# Patient Record
Sex: Male | Born: 1965 | Race: Black or African American | Hispanic: No | Marital: Single | Smoking: Current every day smoker
Health system: Southern US, Community
[De-identification: ages and names within clinical notes are randomized; demographics above are authoritative.]

## PROBLEM LIST (undated history)

## (undated) DIAGNOSIS — J45909 Unspecified asthma, uncomplicated: Secondary | ICD-10-CM

## (undated) DIAGNOSIS — I10 Essential (primary) hypertension: Secondary | ICD-10-CM

## (undated) DIAGNOSIS — E785 Hyperlipidemia, unspecified: Secondary | ICD-10-CM

## (undated) DIAGNOSIS — N200 Calculus of kidney: Secondary | ICD-10-CM

## (undated) HISTORY — DX: Essential (primary) hypertension: I10

## (undated) HISTORY — DX: Hyperlipidemia, unspecified: E78.5

## (undated) HISTORY — DX: Calculus of kidney: N20.0

## (undated) HISTORY — PX: BELOW KNEE LEG AMPUTATION: SUR23

---

## 2007-07-16 ENCOUNTER — Emergency Department (HOSPITAL_COMMUNITY): Admission: EM | Admit: 2007-07-16 | Discharge: 2007-07-16 | Payer: Self-pay | Admitting: Emergency Medicine

## 2009-06-15 ENCOUNTER — Emergency Department (HOSPITAL_BASED_OUTPATIENT_CLINIC_OR_DEPARTMENT_OTHER): Admission: EM | Admit: 2009-06-15 | Discharge: 2009-06-15 | Payer: Self-pay | Admitting: Emergency Medicine

## 2009-08-22 ENCOUNTER — Emergency Department (HOSPITAL_BASED_OUTPATIENT_CLINIC_OR_DEPARTMENT_OTHER): Admission: EM | Admit: 2009-08-22 | Discharge: 2009-08-22 | Payer: Self-pay | Admitting: Emergency Medicine

## 2009-08-22 ENCOUNTER — Ambulatory Visit: Payer: Self-pay | Admitting: Diagnostic Radiology

## 2010-04-03 LAB — URINALYSIS, ROUTINE W REFLEX MICROSCOPIC
Bilirubin Urine: NEGATIVE
Hgb urine dipstick: NEGATIVE
Ketones, ur: NEGATIVE mg/dL
Protein, ur: NEGATIVE mg/dL
pH: 7 (ref 5.0–8.0)

## 2010-04-03 LAB — URINE CULTURE
Culture  Setup Time: 201108051740
Culture: NO GROWTH

## 2010-10-15 LAB — POCT CARDIAC MARKERS
Operator id: 272551
Troponin i, poc: <0.05

## 2010-10-15 LAB — POCT I-STAT, CHEM 8
BUN: 15
Chloride: 106
Sodium: 137
TCO2: 23

## 2010-10-15 LAB — CBC
MCV: 86.2
Platelets: 289
WBC: 7.5

## 2010-10-15 LAB — DIFFERENTIAL
Eosinophils Relative: 2
Monocytes Absolute: 1
Neutro Abs: 4.2

## 2010-10-15 LAB — D-DIMER, QUANTITATIVE: D-Dimer, Quant: 6.37 — ABNORMAL HIGH

## 2011-06-17 ENCOUNTER — Emergency Department (HOSPITAL_BASED_OUTPATIENT_CLINIC_OR_DEPARTMENT_OTHER)
Admission: EM | Admit: 2011-06-17 | Discharge: 2011-06-17 | Disposition: A | Discharge status: Home / Self Care | Payer: BC Managed Care – PPO | Attending: Emergency Medicine | Admitting: Emergency Medicine

## 2011-06-17 ENCOUNTER — Encounter (HOSPITAL_BASED_OUTPATIENT_CLINIC_OR_DEPARTMENT_OTHER): Payer: Self-pay | Admitting: Emergency Medicine

## 2011-06-17 ENCOUNTER — Emergency Department (HOSPITAL_BASED_OUTPATIENT_CLINIC_OR_DEPARTMENT_OTHER): Payer: BC Managed Care – PPO

## 2011-06-17 DIAGNOSIS — Y9367 Activity, basketball: Secondary | ICD-10-CM | POA: Insufficient documentation

## 2011-06-17 DIAGNOSIS — X500XXA Overexertion from strenuous movement or load, initial encounter: Secondary | ICD-10-CM | POA: Insufficient documentation

## 2011-06-17 DIAGNOSIS — S93409A Sprain of unspecified ligament of unspecified ankle, initial encounter: Secondary | ICD-10-CM | POA: Insufficient documentation

## 2011-06-17 MED ORDER — IBUPROFEN 800 MG PO TABS: 800.0000 mg | ORAL_TABLET | Freq: Three times a day (TID) | ORAL | Status: AC | PRN

## 2011-06-17 NOTE — ED Notes (Signed)
Pt playing basketball last evening.  Jumped up and when he came down he landed on someone else's foot, twisting his right ankle inward.

## 2011-06-17 NOTE — Discharge Instructions (Signed)
Ankle Sprain An ankle sprain is an injury to the strong, fibrous tissues (ligaments) that hold the bones of your ankle joint together.  CAUSES Ankle sprain usually is caused by a fall or by twisting your ankle. People who participate in sports are more prone to these types of injuries.  SYMPTOMS  Symptoms of ankle sprain include:  Pain in your ankle. The pain may be present at rest or only when you are trying to stand or walk.   Swelling.   Bruising. Bruising may develop immediately or within 1 to 2 days after your injury.   Difficulty standing or walking.  DIAGNOSIS  Your caregiver will ask you details about your injury and perform a physical exam of your ankle to determine if you have an ankle sprain. During the physical exam, your caregiver will press and squeeze specific areas of your foot and ankle. Your caregiver will try to move your ankle in certain ways. An X-ray exam may be done to be sure a bone was not broken or a ligament did not separate from one of the bones in your ankle (avulsion).  TREATMENT  Certain types of braces can help stabilize your ankle. Your caregiver can make a recommendation for this. Your caregiver may recommend the use of medication for pain. If your sprain is severe, your caregiver may refer you to a surgeon who helps to restore function to parts of your skeletal system (orthopedist) or a physical therapist. HOME CARE INSTRUCTIONS  Apply ice to your injury for 1 to 2 days or as directed by your caregiver. Applying ice helps to reduce inflammation and pain.  Put ice in a plastic bag.   Place a towel between your skin and the bag.   Leave the ice on for 15 to 20 minutes at a time, every 2 hours while you are awake.   Take over-the-counter or prescription medicines for pain, discomfort, or fever only as directed by your caregiver.   Keep your injured leg elevated, when possible, to lessen swelling.   If your caregiver recommends crutches, use them as  instructed. Gradually, put weight on the affected ankle. Continue to use crutches or a cane until you can walk without feeling pain in your ankle.   If you have a plaster splint, wear the splint as directed by your caregiver. Do not rest it on anything harder than a pillow the first 24 hours. Do not put weight on it. Do not get it wet. You may take it off to take a shower or bath.   You may have been given an elastic bandage to wear around your ankle to provide support. If the elastic bandage is too tight (you have numbness or tingling in your foot or your foot becomes cold and blue), adjust the bandage to make it comfortable.   If you have an air splint, you may blow more air into it or let air out to make it more comfortable. You may take your splint off at night and before taking a shower or bath.   Wiggle your toes in the splint several times per day if you are able.  SEEK MEDICAL CARE IF:   You have an increase in bruising, swelling, or pain.   Your toes feel cold.   Pain relief is not achieved with medication.  SEEK IMMEDIATE MEDICAL CARE IF: Your toes are numb or blue or you have severe pain. MAKE SURE YOU:   Understand these instructions.   Will watch your condition.     Will get help right away if you are not doing well or get worse.  Document Released: 01/04/2005 Document Revised: 12/24/2010 Document Reviewed: 08/09/2007 [employer] Patient Information 2012 [employer]. 

## 2011-06-17 NOTE — ED Provider Notes (Signed)
History     CSN: [id number]  Arrival date & time 06/17/11  1546   First MD Initiated Contact with Patient 06/17/11 1610      Chief Complaint  Patient presents with  . Ankle Injury    (Consider location/radiation/quality/duration/timing/severity/associated sxs/prior treatment) The history is provided by the patient.  46 yo male with complaint of ankle injury.  States that he was playing basketball last pm, jumped up and landed on another persons foot resulting in him rolling his ankle inwards.  Pain initially that has continued overnight.  Difficulty walking.  Swelling gradually getting worse overnight.  Has not tried any treatments.  Denies any popping or snapping.    History reviewed. No pertinent past medical history.  History reviewed. No pertinent past surgical history.  No family history on file.  History  Substance Use Topics  . Smoking status: Current Everyday Smoker -- 0.5 packs/day  . Smokeless tobacco: Not on file  . Alcohol Use: Yes     Socially      Review of Systems  Cardiovascular: Negative for leg swelling.  Musculoskeletal: Negative for arthralgias.  Neurological: Negative for weakness and numbness.    Allergies  Review of patient's allergies indicates no known allergies.  Home Medications  No current outpatient prescriptions on file.  BP 116/71  Pulse 66  Temp(Src) 98.3 F (36.8 C) (Oral)  Resp 16  Ht 5\' 8"  (1.727 m)  Wt 176 lb (79.833 kg)  BMI 26.76 kg/m2  SpO2 100%  Physical Exam  Constitutional: He is oriented to person, place, and time. He appears well-nourished.  HENT:  Head: Normocephalic and atraumatic.  Musculoskeletal:       Swelling along lateral aspect of ankle with tenderness along ligamentous complexes.  Some tenderness along anterior portion of distal tibia.  No tenderness at medial or lateral malleolus, or foot.   Patient with difficulty walking on that ankle.     Neurological: He is alert and oriented to person, place, and  time.  Skin:       Small amount of bruising along lateral ankle.     ED Course  Procedures (including critical care time)  Labs Reviewed - No data to display No results found.   No diagnosis found.    MDM  Likely sprain, check ankle films given difficulty walking.    Ankle films without fracture.  As above likely sever sprain.  Will place in air cast, crutches provided.  Home care instructions given including icing and pain control.         Everrett Coombe, DO 06/17/11 1905

## 2011-06-17 NOTE — ED Provider Notes (Signed)
I saw and evaluated the patient, reviewed the resident's note and I agree with the findings and plan. Patient with a mechanical injury to the right ankle noted swelling and pain. Plain films are negative for acute fracture  Gwyneth Sprout, MD 06/17/11 2355

## 2011-06-21 NOTE — ED Notes (Signed)
Pt called stating his ankle feels much better and would like to have [place] fax a note to his employer that he can work without restrictions.

## 2011-07-12 NOTE — ED Notes (Signed)
Patient called requesting insurance info forms to be filled out.  Phone # states phone is disconnected.

## 2012-09-21 ENCOUNTER — Emergency Department (HOSPITAL_BASED_OUTPATIENT_CLINIC_OR_DEPARTMENT_OTHER): Payer: BC Managed Care – PPO

## 2012-09-21 ENCOUNTER — Encounter (HOSPITAL_BASED_OUTPATIENT_CLINIC_OR_DEPARTMENT_OTHER): Payer: Self-pay | Admitting: Emergency Medicine

## 2012-09-21 ENCOUNTER — Emergency Department (HOSPITAL_BASED_OUTPATIENT_CLINIC_OR_DEPARTMENT_OTHER)
Admission: EM | Admit: 2012-09-21 | Discharge: 2012-09-21 | Disposition: A | Discharge status: Home / Self Care | Payer: BC Managed Care – PPO | Attending: Emergency Medicine | Admitting: Emergency Medicine

## 2012-09-21 DIAGNOSIS — R0789 Other chest pain: Secondary | ICD-10-CM

## 2012-09-21 DIAGNOSIS — R209 Unspecified disturbances of skin sensation: Secondary | ICD-10-CM | POA: Insufficient documentation

## 2012-09-21 DIAGNOSIS — F17200 Nicotine dependence, unspecified, uncomplicated: Secondary | ICD-10-CM | POA: Insufficient documentation

## 2012-09-21 DIAGNOSIS — R071 Chest pain on breathing: Secondary | ICD-10-CM | POA: Insufficient documentation

## 2012-09-21 LAB — CBC WITH DIFFERENTIAL/PLATELET
Eosinophils Absolute: 0.1 10*3/uL (ref 0.0–0.7)
Eosinophils Relative: 1 % (ref 0–5)
HCT: 42.6 % (ref 39.0–52.0)
Hemoglobin: 14.7 g/dL (ref 13.0–17.0)
Lymphocytes Relative: 41 % (ref 12–46)
MCH: 30 pg (ref 26.0–34.0)
MCV: 86.9 fL (ref 78.0–100.0)
Monocytes Relative: 11 % (ref 3–12)
RBC: 4.9 MIL/uL (ref 4.22–5.81)
RDW: 13.9 % (ref 11.5–15.5)

## 2012-09-21 LAB — BASIC METABOLIC PANEL
Calcium: 9.9 mg/dL (ref 8.4–10.5)
Chloride: 105 mEq/L (ref 96–112)
Creatinine, Ser: 1 mg/dL (ref 0.50–1.35)
GFR calc Af Amer: >90 mL/min (ref >90)
Glucose, Bld: 136 mg/dL — ABNORMAL HIGH (ref 70–99)
Potassium: 3.4 mEq/L — ABNORMAL LOW (ref 3.5–5.1)
Sodium: 142 mEq/L (ref 135–145)

## 2012-09-21 LAB — TROPONIN I: Troponin I: <0.3 ng/mL (ref <0.30)

## 2012-09-21 MED ORDER — ASPIRIN 81 MG PO CHEW
324.0000 mg | CHEWABLE_TABLET | Freq: Once | ORAL | Status: AC
  Administered 2012-09-21: 324 mg via ORAL
  Filled 2012-09-21: qty 4

## 2012-09-21 MED ORDER — GI COCKTAIL ~~LOC~~
ORAL | Status: AC
  Administered 2012-09-21: 30 mL via ORAL
  Filled 2012-09-21: qty 30

## 2012-09-21 MED ORDER — GI COCKTAIL ~~LOC~~
30.0000 mL | Freq: Once | ORAL | Status: AC
  Administered 2012-09-21: 30 mL via ORAL

## 2012-09-21 NOTE — ED Provider Notes (Signed)
CSN: [id number]     Arrival date & time 09/21/12  1743 History   First MD Initiated Contact with Patient 09/21/12 1819     Chief Complaint  Patient presents with  . Chest Pain  . Numbness   (Consider location/radiation/quality/duration/timing/severity/associated sxs/prior Treatment) Patient is a 47 y.o. male presenting with chest pain. The history is provided by the patient. No language interpreter was used.  Chest Pain Pain location:  L chest Pain quality: stabbing   Pain radiates to:  Does not radiate Pain radiates to the back: no   Pain severity:  Moderate Onset quality:  Gradual Timing:  Constant Chronicity:  New Relieved by:  Nothing Worsened by:  Nothing tried Pt complains of sharp stabbing pain in the right side of his chest.   Pt reports strenuous work today.  History reviewed. No pertinent past medical history. History reviewed. No pertinent past surgical history. No family history on file. History  Substance Use Topics  . Smoking status: Current Every Day Smoker -- 0.50 packs/day  . Smokeless tobacco: Not on file  . Alcohol Use: Yes     Comment: Socially    Review of Systems  Cardiovascular: Positive for chest pain.  All other systems reviewed and are negative.    Allergies  Review of patient's allergies indicates no known allergies.  Home Medications  No current outpatient prescriptions on file. BP 140/85  Pulse 88  Temp(Src) 98.6 F (37 C) (Oral)  Resp 18  Ht 5\' 5"  (1.651 m)  Wt 185 lb (83.915 kg)  BMI 30.79 kg/m2  SpO2 100% Physical Exam  Nursing note and vitals reviewed. Constitutional: He is oriented to person, place, and time. He appears well-developed and well-nourished.  HENT:  Head: Normocephalic.  Right Ear: External ear normal.  Left Ear: External ear normal.  Nose: Nose normal.  Mouth/Throat: Oropharynx is clear and moist.  Eyes: Conjunctivae and EOM are normal. Pupils are equal, round, and reactive to light.  Neck: Normal range of  motion. Neck supple.  Cardiovascular: Normal rate and regular rhythm.   Pulmonary/Chest: Effort normal.  Abdominal: Soft.  Musculoskeletal: Normal range of motion.  Neurological: He is alert and oriented to person, place, and time. He has normal reflexes.  Skin: Skin is warm.  Psychiatric: He has a normal mood and affect.    ED Course  Procedures (including critical care time) Labs Review Labs Reviewed  BASIC METABOLIC PANEL - Abnormal; Notable for the following:    Potassium 3.4 (*)    Glucose, Bld 136 (*)    GFR calc non Af Amer 89 (*)    All other components within normal limits  CBC WITH DIFFERENTIAL  TROPONIN I   Imaging Review Dg Chest 2 View  09/21/2012   *RADIOLOGY REPORT*  Clinical Data: Chest pain  CHEST - 2 VIEW  Comparison: July 16, 2007  Findings: There is no focal infiltrate, pulmonary edema, or pleural effusion.  The mediastinal contour is stable.  The heart size is normal.  The soft tissues and osseous structures are normal.  IMPRESSION: No acute cardiopulmonary disease identified   Original Report Authenticated By: Sherian Rein, M.D.   Date: 09/21/2012  Rate: 83  Rhythm: normal sinus rhythm  QRS Axis: normal  Intervals: normal  ST/T Wave abnormalities: normal  Conduction Disutrbances:nonspecific intraventricular conduction delay  Narrative Interpretation:   Old EKG Reviewed: none available Results for orders placed during the hospital encounter of 09/21/12  CBC WITH DIFFERENTIAL      Result Value Range  WBC 6.0  4.0 - 10.5 K/uL   RBC 4.90  4.22 - 5.81 MIL/uL   Hemoglobin 14.7  13.0 - 17.0 g/dL   HCT 11.9  14.7 - 82.9 %   MCV 86.9  78.0 - 100.0 fL   MCH 30.0  26.0 - 34.0 pg   MCHC 34.5  30.0 - 36.0 g/dL   RDW 56.2  13.0 - 86.5 %   Platelets 249  150 - 400 K/uL   Neutrophils Relative % 47  43 - 77 %   Neutro Abs 2.9  1.7 - 7.7 K/uL   Lymphocytes Relative 41  12 - 46 %   Lymphs Abs 2.4  0.7 - 4.0 K/uL   Monocytes Relative 11  3 - 12 %   Monocytes  Absolute 0.7  0.1 - 1.0 K/uL   Eosinophils Relative 1  0 - 5 %   Eosinophils Absolute 0.1  0.0 - 0.7 K/uL   Basophils Relative 1  0 - 1 %   Basophils Absolute 0.0  0.0 - 0.1 K/uL  BASIC METABOLIC PANEL      Result Value Range   Sodium 142  135 - 145 mEq/L   Potassium 3.4 (*) 3.5 - 5.1 mEq/L   Chloride 105  96 - 112 mEq/L   CO2 25  19 - 32 mEq/L   Glucose, Bld 136 (*) 70 - 99 mg/dL   BUN 16  6 - 23 mg/dL   Creatinine, Ser 7.84  0.50 - 1.35 mg/dL   Calcium 9.9  8.4 - 69.6 mg/dL   GFR calc non Af Amer 89 (*) >90 mL/min   GFR calc Af Amer >90  >90 mL/min  TROPONIN I      Result Value Range   Troponin I <0.30  <0.30 ng/mL  TROPONIN I      Result Value Range   Troponin I <0.30  <0.30 ng/mL   Dg Chest 2 View  09/21/2012   *RADIOLOGY REPORT*  Clinical Data: Chest pain  CHEST - 2 VIEW  Comparison: July 16, 2007  Findings: There is no focal infiltrate, pulmonary edema, or pleural effusion.  The mediastinal contour is stable.  The heart size is normal.  The soft tissues and osseous structures are normal.  IMPRESSION: No acute cardiopulmonary disease identified   Original Report Authenticated By: Sherian Rein, M.D.     MDM   1. Chest pain, atypical    Pt does not want to be admitted.   Troponin negative x 2.   Pain is constant, atypical.   Pt advised of risk of heart disease and that he could have CAD.   Pt given primary care referrals,   Pt advised of need to return if pain worsens changes.  Pt counseled by me at length about risk.  Pt has a court date in am that he reports he can not miss.     Lonia Skinner Hardwick, PA-C 09/21/12 2256

## 2012-09-21 NOTE — ED Notes (Signed)
The discharge disposition entered at 2257 hours was entered on the wrong patient. Please disregard entry.

## 2012-09-21 NOTE — ED Provider Notes (Signed)
Medical screening examination/treatment/procedure(s) were performed by non-physician practitioner and as supervising physician I was immediately available for consultation/collaboration.   Cary Lothrop, MD 09/21/12 2348 

## 2012-09-21 NOTE — ED Notes (Signed)
Pt at work at 1700 and got sharp left sided cp.  Nonradiating.  Numbness in left arm.  No hx of cp.

## 2012-09-21 NOTE — ED Notes (Signed)
Esignature at 1101PM error. Wrong chart.

## 2013-03-27 ENCOUNTER — Encounter (HOSPITAL_BASED_OUTPATIENT_CLINIC_OR_DEPARTMENT_OTHER): Payer: Self-pay | Admitting: Emergency Medicine

## 2013-03-27 ENCOUNTER — Emergency Department (HOSPITAL_BASED_OUTPATIENT_CLINIC_OR_DEPARTMENT_OTHER): Payer: BC Managed Care – PPO

## 2013-03-27 ENCOUNTER — Emergency Department (HOSPITAL_BASED_OUTPATIENT_CLINIC_OR_DEPARTMENT_OTHER)
Admission: EM | Admit: 2013-03-27 | Discharge: 2013-03-27 | Disposition: A | Discharge status: Home / Self Care | Payer: BC Managed Care – PPO | Attending: Emergency Medicine | Admitting: Emergency Medicine

## 2013-03-27 DIAGNOSIS — M79604 Pain in right leg: Secondary | ICD-10-CM

## 2013-03-27 DIAGNOSIS — S99919A Unspecified injury of unspecified ankle, initial encounter: Principal | ICD-10-CM

## 2013-03-27 DIAGNOSIS — F17200 Nicotine dependence, unspecified, uncomplicated: Secondary | ICD-10-CM | POA: Insufficient documentation

## 2013-03-27 DIAGNOSIS — S8990XA Unspecified injury of unspecified lower leg, initial encounter: Secondary | ICD-10-CM | POA: Insufficient documentation

## 2013-03-27 DIAGNOSIS — IMO0002 Reserved for concepts with insufficient information to code with codable children: Secondary | ICD-10-CM | POA: Insufficient documentation

## 2013-03-27 DIAGNOSIS — Y9301 Activity, walking, marching and hiking: Secondary | ICD-10-CM | POA: Insufficient documentation

## 2013-03-27 DIAGNOSIS — S99929A Unspecified injury of unspecified foot, initial encounter: Principal | ICD-10-CM

## 2013-03-27 DIAGNOSIS — Y929 Unspecified place or not applicable: Secondary | ICD-10-CM | POA: Insufficient documentation

## 2013-03-27 MED ORDER — HYDROCODONE-ACETAMINOPHEN 5-325 MG PO TABS: 1.0000 | ORAL_TABLET | Freq: Four times a day (QID) | ORAL | Status: DC | PRN

## 2013-03-27 NOTE — Discharge Instructions (Signed)
Musculoskeletal Pain °Musculoskeletal pain is muscle and boney aches and pains. These pains can occur in any part of the body. Your caregiver may treat you without knowing the cause of the pain. They may treat you if blood or urine tests, X-rays, and other tests were normal.  °CAUSES °There is often not a definite cause or reason for these pains. These pains may be caused by a type of germ (virus). The discomfort may also come from overuse. Overuse includes working out too hard when your body is not fit. Boney aches also come from weather changes. Bone is sensitive to atmospheric pressure changes. °HOME CARE INSTRUCTIONS  °· Ask when your test results will be ready. Make sure you get your test results. °· Only take over-the-counter or prescription medicines for pain, discomfort, or fever as directed by your caregiver. If you were given medications for your condition, do not drive, operate machinery or power tools, or sign legal documents for 24 hours. Do not drink alcohol. Do not take sleeping pills or other medications that may interfere with treatment. °· Continue all activities unless the activities cause more pain. When the pain lessens, slowly resume normal activities. Gradually increase the intensity and duration of the activities or exercise. °· During periods of severe pain, bed rest may be helpful. Lay or sit in any position that is comfortable. °· Putting ice on the injured area. °· Put ice in a bag. °· Place a towel between your skin and the bag. °· Leave the ice on for 15 to 20 minutes, 3 to 4 times a day. °· Follow up with your caregiver for continued problems and no reason can be found for the pain. If the pain becomes worse or does not go away, it may be necessary to repeat tests or do additional testing. Your caregiver may need to look further for a possible cause. °SEEK IMMEDIATE MEDICAL CARE IF: °· You have pain that is getting worse and is not relieved by medications. °· You develop chest pain  that is associated with shortness or breath, sweating, feeling sick to your stomach (nauseous), or throw up (vomit). °· Your pain becomes localized to the abdomen. °· You develop any new symptoms that seem different or that concern you. °MAKE SURE YOU:  °· Understand these instructions. °· Will watch your condition. °· Will get help right away if you are not doing well or get worse. °Document Released: 01/04/2005 Document Revised: 03/29/2011 Document Reviewed: 09/08/2012 °ExitCare® Patient Information ©2014 [employer]. ° ° °Emergency Department Resource Guide °1) Find a Doctor and Pay Out of Pocket °Although you won't have to find out who is covered by your insurance plan, it is a good idea to ask around and get recommendations. You will then need to call the office and see if the doctor you have chosen will accept you as a new patient and what types of options they offer for patients who are self-pay. Some doctors offer discounts or will set up payment plans for their patients who do not have insurance, but you will need to ask so you aren't surprised when you get to your appointment. ° °2) Contact Your Local Health Department °Not all health departments have doctors that can see patients for sick visits, but many do, so it is worth a call to see if yours does. If you don't know where your local health department is, you can check in your phone book. The CDC also has a tool to help you locate your state's   health department, and many state websites also have listings of all of their local health departments. ° °3) Find a Walk-in Clinic °If your illness is not likely to be very severe or complicated, you may want to try a walk in clinic. These are popping up all over the country in pharmacies, drugstores, and shopping centers. They're usually staffed by nurse practitioners or physician assistants that have been trained to treat common illnesses and complaints. They're usually fairly quick and inexpensive. However,  if you have serious medical issues or chronic medical problems, these are probably not your best option. ° °No Primary Care Doctor: °- Call [facility] at  832-8000 - they can help you locate a primary care doctor that  accepts your insurance, provides certain services, etc. °- Physician Referral Service- 1-800-533-3463 ° °Chronic Pain Problems: °Organization         Address  Phone   Notes  °[facility]  (336) 297-2271 Patients need to be referred by their primary care doctor.  ° °Medication Assistance: °Organization         Address  Phone   Notes  °[place] [facility] [address] °[place], [place] [address] (336) 641-8030 --Must be a resident of [place] °-- Must have NO insurance coverage whatsoever (no Medicaid/ Medicare, etc.) °-- The pt. MUST have a primary care doctor that directs their care regularly and follows them in the community °  °[facility]  (866) 331-1348   °[employer]  (888) 892-1162   ° °Agencies that provide inexpensive medical care: °Organization         Address  Phone   Notes  °[facility]  (336) 832-8035   °[facility]    (336) 832-7272   °[facility] [address] °[place], [place] [address] (336) 832-4777   °[facility] [address], °[place] (336) 271-4999   °[facility]    (336) 373-0678   °[facility]    (336) 272-1050   °[facility] ° [address], [place] Phone:  (336) 832-4444, Fax:  (336) 832-4440 Hours of Operation:  9 am - 6 pm, M-F.  Also accepts Medicaid/Medicare and self-pay.  °[facility] ° [address], [place] Phone: (336) 832-3150, Fax: (336) 832-3151. Hours of Operation:  8:30 am - 5:30 pm, M-F.  Also accepts Medicaid and self-pay.  °[facility] [place] [address], [place] Phone: (336) 878-6027   °[facility] [address], [place], [place] (336)723-1848, Ext. 123 Mondays & Thursdays: 7-9 AM.  First 15 patients are seen on a first come, first serve basis. °  ° °Medicaid-accepting [place] Providers: ° °Organization         Address  Phone   Notes  °[facility] [address], [place] (336) 641-2100 Also accepts self-pay patients.  °[facility] [address], [place] ° (336) 856-9996   °[facility] [address], [place] (336) 288-8857   °[facility] [address] [place] [address], [place] (336) 299-7000   °Veita Bland [address], [place]  ° (336) 373-1557 Only accepts [place] Access Medicaid patients after they have their name applied to their card.  ° °Self-Pay (no insurance) in [place]: ° °Organization           Address  Phone   Notes  °Sickle Cell Patients, [facility] [address], [place] (336) 832-1970   °[facility] [address], [place] (336) 832-4400   °[facility] ° [address], [place] (336) 992-4800   °[facility]/Dr. Osei-Bonsu ° [address] [place] [address], [place] or [address], [place] (336) 841-8500 Phone number for both [place] and [place] locations is the same.  °[facility] [address], [place] (336) 299-0000   °[facility] [address] [place] [address], [place] or [address] (336) 852-7530 °(336) 878-2260   °[facility] [address], [place] (336) 350-1642, phone; (336) 294-5005, fax Sees patients 1st and 3rd Saturday of every month.  Must not qualify for public or private insurance (i.e. Medicaid, Medicare, [place] Health Choice, Veterans' Benefits) • Household income should be no more than 200% of the poverty level •The clinic cannot treat you if you are pregnant or think you are  pregnant • Sexually transmitted diseases are not treated at the clinic.  ° ° °Dental Care: °Organization         Address  Phone  Notes  °[facility] [facility] [address], [place] (336) 641-6152 Accepts children up to age 21 who are enrolled in Medicaid or [place] Health Choice; pregnant women with a Medicaid card; and children who have applied for Medicaid or [place] Health Choice, but were declined, whose parents can pay a reduced fee at time of service.  °[facility] [place]  [address], [place] (336) 641-7733 Accepts children up to age 21 who are enrolled in Medicaid or [place] Health Choice; pregnant women with a Medicaid card; and children who have applied for Medicaid or [place] Health Choice, but were declined, whose parents can pay a reduced fee at time of service.  °[facility] ° [address],  (336) 641-4533 Patients are seen by appointment only. Walk-ins are not accepted. [facility] will see patients 18 years of age and older. °Monday - Tuesday (8am-5pm) °Most Wednesdays (8:30-5pm) °$30 per visit, cash only  °[facility] ° [address], [place] (336) 641-4533 Patients are seen by appointment only. Walk-ins are not accepted. [facility] will see patients 18 years of age and older. °One Wednesday Evening (Monthly: Volunteer Based).  $30 per visit, cash only  °[facility]  (919) 537-3737 for adults; Children under age 4, call Graduate Pediatric Dentistry at (919) [phone]. Children aged 4-14, please call (919) [phone] to request a pediatric application. ° Dental services are provided in all areas of dental care including fillings, crowns and bridges, complete and partial dentures, implants, gum treatment, root canals, and extractions. Preventive care is also provided. Treatment is provided to both adults and  children. °Patients are selected via a lottery and there is often a waiting list. °  °[facility] [address], ° ° (336) 763-8833 [email or web address] °  °[facility] [address], [place], [place] (336)723-1848, Ext. 123 Second and Fourth Thursday of each month, opens at 6:30 AM; Clinic ends at 9 AM.  Patients are seen on a first-come first-served basis, and a limited number are seen during each clinic.  ° °[facility] ° [address], [place], [place] (336) 723-7904   Eligibility Requirements °You must have lived in [place], [place], or [place] counties for at least the last three months. °  You cannot be eligible for state or federal sponsored healthcare insurance, including Veterans Administration, Medicaid, or Medicare. °  You generally cannot be eligible for healthcare insurance through your employer.  °  How to apply: °Eligibility screenings are held every Tuesday and Wednesday afternoon from 1:00 pm until 4:00 pm. You do not need an appointment for the interview!  °[facility] [address], [place], [place] 336-631-2330   °[facility]  336-342-8273   °[facility]  336-703-3100   °[facility]  336-570-6415   ° °Behavioral Health Resources in the Community: °Intensive Outpatient Programs °Organization         Address  Phone  Notes  °[facility] [address], [place], [place] 336-878-6098   °[facility] [address], [place], [place] 336-832-9800   °[facility] [address], [place], [place] ° 336-882-2125   °[facility] [address],  °[place], [place] 1-800-853-5163 or 336-641-4981   °Substance Abuse Resources °Organization         Address  Phone  Notes  °[facility]  336-882-2125   °[facility]  336-784-9470   °[facility]  336-285-9073    °[facility]  336-845-3988   °Residential & Outpatient Substance Abuse Program  1-800-659-3381   °Psychological Services °Organization         Address  Phone  Notes  °[facility]  336- 832-9600   °[facility]  336- 378-7881   °[facility] [address], [place] 1-800-853-5163 or 336-641-4981   ° °Mobile Crisis Teams °Organization         Address  Phone  Notes  °[facility], Mobile Crisis Care Unit  1-877-626-1772   °Assertive °[facility] ° [address] [place], [place] 336-834-9664   °Sharon DeEsch [address] °[place] 336-554-5454   ° °Self-Help/Support Groups °Organization         Address  Phone             Notes  °[facility] - variety of support groups  336- 373-1402 Call for more information  °Narcotics Anonymous (NA), [facility] [address], °[place]  2 meetings at this location  ° °Residential Treatment Programs °Organization         Address  Phone  Notes  °[facility] [address],    °[place]  1-866-801-8205   °[facility] ° [address], [place], [place] 704-293-8524   °[facility] [address], [place] 336-845-3988 Admissions: 8am-3pm M-F  °[facility] [address]    °[place], [place] 336-841-1104   °[facility] [address], [place], [place] 336-379-7146   °[facility] [address]  °[place], [place] 336-285-9073   °[facility] Intensive Outpatient [address], [place], [place] 336-852-3033   °[facility] [address]  °[place], [place] 1-877-615-2722 or 336-784-9470   °[facility] [address] [place], [place] 336-227-7417 Accepts Medicaid  °[facility] [address]  °[place] 1-800-659-3381 Substance Abuse/Addiction Treatment  ° °[place]  Behavioral Health Resources °Organization           Address  Phone  Notes  °[facility]  (888) 581-9988   °Julie Brannon, PhD [address] [place], [place]   (336) 349-5553 or (336) 951-0000   °[facility]   [address] °[place], [place] (336) 349-4454   °[facility] [address], [place], [place] (336) 342-8316 Insurance/Medicaid/sponsorship through [facility]  °[facility] [address]                                    [place], [place] (336) 342-8316 Therapy/tele-psych/case  °[facility] [address]  ° [place], [place] (336) 349-2233    °Dr. Arfeen  (336) 349-4544   °[facility] [place]  [employer] [place] [facility] 1) [address], [place] °2) [address], [place] °3)  [address], [place] (336) 349-3220 °(336) 342-7768 ° °(336) 342-8140   °[place] [facility] (336) 342-1394 or (336) 342-3537 (After Hours)    ° ° ° °

## 2013-03-27 NOTE — ED Notes (Signed)
Patient transported to X-ray via stretcher per tech. 

## 2013-03-27 NOTE — ED Notes (Signed)
MD at bedside. 

## 2013-03-27 NOTE — ED Notes (Signed)
Right leg pain x 1-2 weeks-denies injury-denies recent travel

## 2013-03-27 NOTE — ED Provider Notes (Signed)
CSN: [id number]     Arrival date & time 03/27/13  1430 History   First MD Initiated Contact with Patient 03/27/13 1456     Chief Complaint  Patient presents with  . Leg Pain     (Consider location/radiation/quality/duration/timing/severity/associated sxs/prior Treatment) HPI Comments: Hit leg on his horseshoe pit 1 week ago. States intermittent leg pain for past 2 weeks - has episodes where his leg feels like it locks up. No falls or other trauma.  Patient is a 48 y.o. male presenting with leg pain. The history is provided by the patient.  Leg Pain Location:  Leg Time since incident:  1 week Injury: yes (1 week ago - walked into his horseshoe pit)   Mechanism of injury comment:  Hit leg on wooden horseshoe pit while walking Leg location:  R leg and R upper leg Pain details:    Quality:  Aching   Severity:  Mild   Onset quality:  Gradual   Duration:  2 weeks   Timing:  Intermittent   Progression:  Worsening Chronicity:  New Dislocation: no   Associated symptoms: no fever     History reviewed. No pertinent past medical history. History reviewed. No pertinent past surgical history. No family history on file. History  Substance Use Topics  . Smoking status: Current Every Day Smoker -- 0.50 packs/day  . Smokeless tobacco: Not on file  . Alcohol Use: Yes    Review of Systems  Constitutional: Negative for fever and chills.  Respiratory: Negative for cough and shortness of breath.   Gastrointestinal: Negative for vomiting and abdominal pain.  Musculoskeletal: Positive for myalgias. Negative for arthralgias and joint swelling.  Skin: Negative for rash.  All other systems reviewed and are negative.      Allergies  Review of patient's allergies indicates no known allergies.  Home Medications  No current outpatient prescriptions on file. BP 148/93  Pulse 105  Temp(Src) 98.4 F (36.9 C) (Oral)  Resp 18  Ht 5\' 5"  (1.651 m)  Wt 180 lb (81.647 kg)  BMI 29.95 kg/m2   SpO2 100% Physical Exam  Nursing note and vitals reviewed. Constitutional: He is oriented to person, place, and time. He appears well-developed and well-nourished. No distress.  HENT:  Head: Normocephalic and atraumatic.  Mouth/Throat: No oropharyngeal exudate.  Eyes: EOM are normal. Pupils are equal, round, and reactive to light.  Neck: Normal range of motion. Neck supple.  Cardiovascular: Normal rate and regular rhythm.  Exam reveals no friction rub.   No murmur heard. Pulmonary/Chest: Effort normal and breath sounds normal. No respiratory distress. He has no wheezes. He has no rales.  Abdominal: He exhibits no distension. There is no tenderness. There is no rebound.  Musculoskeletal: Normal range of motion. He exhibits tenderness (mild, posterior calf, upper R thigh). He exhibits no edema.  Neurological: He is alert and oriented to person, place, and time. No cranial nerve deficit. He exhibits normal muscle tone.  Skin: No rash noted. He is not diaphoretic.    ED Course  Procedures (including critical care time) Labs Review Labs Reviewed - No data to display Imaging Review Dg Femur Right  03/27/2013   CLINICAL DATA Trauma with distal thigh pain.  EXAM RIGHT FEMUR - 2 VIEW  COMPARISON None.  FINDINGS There is no evidence of fracture or other focal bone lesions. Soft tissues are unremarkable.  IMPRESSION Negative.  SIGNATURE  Electronically Signed   By: Tiburcio Pea M.D.   On: 03/27/2013 15:51   Dg  Tibia/fibula Right  03/27/2013   CLINICAL DATA Leg pain after trauma.  EXAM RIGHT TIBIA AND FIBULA - 2 VIEW  COMPARISON Contemporaneous femur radiography  FINDINGS Questionable pretibial edema. No acute fracture. No malalignment. Insertional changes to the posterior, proximal tibia. Incidental plantar and dorsal calcaneal spurring.  IMPRESSION No acute osseous abnormality.  SIGNATURE  Electronically Signed   By: Tiburcio PeaJonathan  Watts M.D.   On: 03/27/2013 15:50   [place] Venous Img Lower Unilateral  Right  03/27/2013   CLINICAL DATA Two week history of right leg pain  EXAM RIGHT LOWER EXTREMITY VENOUS DUPLEX ULTRASOUND  TECHNIQUE Gray-scale sonography with graded compression, as well as color Doppler and duplex ultrasound, were performed to evaluate the deep venous system from the level of the common femoral vein through the popliteal and proximal calf veins. Spectral Doppler was utilized to evaluate flow at rest and with distal augmentation maneuvers.  COMPARISON None.  FINDINGS Flow in the venous structures of the right lower extremity is spontaneous and phasic in all segments. There is normal compression and augmentation in the venous structures of the right lower extremity. Venous Doppler signal is normal in all regions. There is no thrombus in the deep or visualized superficial venous structures on the right. There is no right lower extremity deep venous incompetence.  IMPRESSION No evidence of right lower extremity deep venous thrombosis.  SIGNATURE  Electronically Signed   By: Bretta BangWilliam  Woodruff M.D.   On: 03/27/2013 16:23     EKG Interpretation None      MDM   Final diagnoses:  Right leg pain    42M here with leg pain for 2 weeks. Described as sensation of his leg "locking-up." No falls, but did walk into his horseshoe pit and hit his leg. Stands for long periods at work. No leg swelling. No recent travel, surgery. No SOB, N/V, other problems. No edema, full knee ROM, normal pulses. Patient concerned about DVT, with mild trauma, will US. Imaging negative. Stable for discharge.  Dagmar HaitWilliam Shamus [place], MD 03/27/13 2322

## 2013-04-22 ENCOUNTER — Encounter (HOSPITAL_BASED_OUTPATIENT_CLINIC_OR_DEPARTMENT_OTHER): Payer: Self-pay | Admitting: Emergency Medicine

## 2013-04-22 ENCOUNTER — Emergency Department (HOSPITAL_BASED_OUTPATIENT_CLINIC_OR_DEPARTMENT_OTHER)
Admission: EM | Admit: 2013-04-22 | Discharge: 2013-04-22 | Disposition: A | Discharge status: Home / Self Care | Payer: BC Managed Care – PPO | Attending: Emergency Medicine | Admitting: Emergency Medicine

## 2013-04-22 ENCOUNTER — Emergency Department (HOSPITAL_BASED_OUTPATIENT_CLINIC_OR_DEPARTMENT_OTHER): Payer: BC Managed Care – PPO

## 2013-04-22 DIAGNOSIS — F17200 Nicotine dependence, unspecified, uncomplicated: Secondary | ICD-10-CM | POA: Insufficient documentation

## 2013-04-22 DIAGNOSIS — M51379 Other intervertebral disc degeneration, lumbosacral region without mention of lumbar back pain or lower extremity pain: Secondary | ICD-10-CM | POA: Insufficient documentation

## 2013-04-22 DIAGNOSIS — M5137 Other intervertebral disc degeneration, lumbosacral region: Secondary | ICD-10-CM | POA: Insufficient documentation

## 2013-04-22 DIAGNOSIS — M519 Unspecified thoracic, thoracolumbar and lumbosacral intervertebral disc disorder: Secondary | ICD-10-CM

## 2013-04-22 NOTE — ED Provider Notes (Addendum)
CSN: [id number]     Arrival date & time 04/22/13  1107 History   First MD Initiated Contact with Patient 04/22/13 1124     Chief Complaint  Patient presents with  . Back Pain     (Consider location/radiation/quality/duration/timing/severity/associated sxs/prior Treatment) Patient is a 48 y.o. male presenting with back pain. The history is provided by the patient.  Back Pain Location:  Lumbar spine Quality:  Aching and stiffness Stiffness is present: intermittent. Radiates to:  L thigh Pain severity:  Moderate Pain is:  Unable to specify Onset quality:  Gradual Duration:  2 weeks Timing:  Sporadic Progression:  Waxing and waning Chronicity:  New Context comment:  Stands on concrete all day at work in work boots.  used to do a job with lots of lifting Relieved by:  Bed rest Worsened by:  Bending and twisting Ineffective treatments:  None tried Associated symptoms: leg pain   Associated symptoms: no abdominal pain, no bladder incontinence, no bowel incontinence, no numbness, no paresthesias, no perianal numbness and no weakness     History reviewed. No pertinent past medical history. History reviewed. No pertinent past surgical history. No family history on file. History  Substance Use Topics  . Smoking status: Current Every Day Smoker -- 0.50 packs/day  . Smokeless tobacco: Not on file  . Alcohol Use: Yes    Review of Systems  Gastrointestinal: Negative for abdominal pain and bowel incontinence.  Genitourinary: Negative for bladder incontinence.  Musculoskeletal: Positive for back pain.  Neurological: Negative for weakness, numbness and paresthesias.  All other systems reviewed and are negative.      Allergies  Review of patient's allergies indicates no known allergies.  Home Medications   Current Outpatient Rx  Name  Route  Sig  Dispense  Refill  . HYDROcodone-acetaminophen (NORCO/VICODIN) 5-325 MG per tablet   Oral   Take 1 tablet by mouth every 6 (six)  hours as needed for moderate pain.   10 tablet   0    BP 172/104  Pulse 99  Temp(Src) 97.9 F (36.6 C) (Oral)  Resp 20  SpO2 99% Physical Exam  Nursing note and vitals reviewed. Constitutional: He is oriented to person, place, and time. He appears well-developed and well-nourished. No distress.  HENT:  Head: Normocephalic and atraumatic.  Mouth/Throat: Oropharynx is clear and moist.  Eyes: EOM are normal. Pupils are equal, round, and reactive to light.  Neck: Normal range of motion. Neck supple. No spinous process tenderness and no muscular tenderness present. No rigidity. Normal range of motion present.  Cardiovascular: Normal rate, regular rhythm, normal heart sounds and intact distal pulses.   No murmur heard. Pulmonary/Chest: Effort normal and breath sounds normal. He has no wheezes. He has no rales.  Abdominal: Soft. He exhibits no distension. There is no tenderness. There is no CVA tenderness.  Musculoskeletal: He exhibits no tenderness.       Lumbar back: He exhibits bony tenderness and pain. He exhibits normal range of motion, no swelling, no deformity and normal pulse.  Neurological: He is alert and oriented to person, place, and time. He has normal strength. No sensory deficit. Coordination normal.  Reflex Scores:      Patellar reflexes are 1+ on the right side and 1+ on the left side. Skin: Skin is warm and dry. No rash noted.  Psychiatric: He has a normal mood and affect.    ED Course  Procedures (including critical care time) Labs Review Labs Reviewed - No data to display  Imaging Review Dg Lumbar Spine Complete  04/22/2013   CLINICAL DATA:  Recent injury with back pain  EXAM: LUMBAR SPINE - COMPLETE 4+ VIEW  COMPARISON:  08/22/2009  FINDINGS: No acute fracture is noted. No acute facet abnormality is seen. Mild disc space narrowing is noted at L4-5 no soft tissue changes are noted.  IMPRESSION: No acute abnormality seen.   Electronically Signed   By: Alcide Clever M.D.    On: 04/22/2013 11:54     EKG Interpretation None      MDM   Final diagnoses:  Lumbar disc disease    Pt with gradual onset of back pain suggestive of radiculopathy.  No neurovascular compromise and no incontinence.  Pt has no infectious sx, hx of CA  or other red flags concerning for pathologic back pain.  Pt is able to ambulate without difficulty.  Normal strength and reflexes on exam.  Denies trauma.  Plain films with L4/5 narrowing but o/w wnl. Pt given back exercises and to use antiinflammatories prn.     Gwyneth Sprout, MD 04/22/13 1203  Gwyneth Sprout, MD 04/22/13 [phone]

## 2013-04-22 NOTE — ED Notes (Signed)
Patient states he had lower back spasms that started yesterday and eased off this morning. He states he has had this happen several times over the past few weeks and is concerned because of the reoccurring pain

## 2018-05-23 ENCOUNTER — Encounter (HOSPITAL_BASED_OUTPATIENT_CLINIC_OR_DEPARTMENT_OTHER): Payer: Self-pay | Admitting: Emergency Medicine

## 2018-05-23 ENCOUNTER — Emergency Department (HOSPITAL_BASED_OUTPATIENT_CLINIC_OR_DEPARTMENT_OTHER): Payer: BLUE CROSS/BLUE SHIELD

## 2018-05-23 ENCOUNTER — Other Ambulatory Visit: Payer: Self-pay

## 2018-05-23 ENCOUNTER — Emergency Department (HOSPITAL_BASED_OUTPATIENT_CLINIC_OR_DEPARTMENT_OTHER)
Admission: EM | Admit: 2018-05-23 | Discharge: 2018-05-23 | Disposition: A | Discharge status: Home / Self Care | Payer: BLUE CROSS/BLUE SHIELD | Attending: Emergency Medicine | Admitting: Emergency Medicine

## 2018-05-23 DIAGNOSIS — J45909 Unspecified asthma, uncomplicated: Secondary | ICD-10-CM | POA: Insufficient documentation

## 2018-05-23 DIAGNOSIS — R51 Headache: Secondary | ICD-10-CM | POA: Insufficient documentation

## 2018-05-23 DIAGNOSIS — F17210 Nicotine dependence, cigarettes, uncomplicated: Secondary | ICD-10-CM | POA: Diagnosis not present

## 2018-05-23 DIAGNOSIS — R519 Headache, unspecified: Secondary | ICD-10-CM

## 2018-05-23 DIAGNOSIS — I10 Essential (primary) hypertension: Secondary | ICD-10-CM | POA: Diagnosis not present

## 2018-05-23 HISTORY — DX: Unspecified asthma, uncomplicated: J45.909

## 2018-05-23 LAB — BASIC METABOLIC PANEL
Anion gap: 9 (ref 5–15)
BUN: 21 mg/dL — ABNORMAL HIGH (ref 6–20)
CO2: 22 mmol/L (ref 22–32)
Calcium: 9.2 mg/dL (ref 8.9–10.3)
Chloride: 107 mmol/L (ref 98–111)
Creatinine, Ser: 0.95 mg/dL (ref 0.61–1.24)
GFR calc Af Amer: >60 mL/min (ref >60)
GFR calc non Af Amer: >60 mL/min (ref >60)
Glucose, Bld: 98 mg/dL (ref 70–99)
Potassium: 3.8 mmol/L (ref 3.5–5.1)
Sodium: 138 mmol/L (ref 135–145)

## 2018-05-23 LAB — CBC
HCT: 45.1 % (ref 39.0–52.0)
Hemoglobin: 14.6 g/dL (ref 13.0–17.0)
MCH: 28.9 pg (ref 26.0–34.0)
MCHC: 32.4 g/dL (ref 30.0–36.0)
MCV: 89.3 fL (ref 80.0–100.0)
Platelets: 283 10*3/uL (ref 150–400)
RBC: 5.05 MIL/uL (ref 4.22–5.81)
RDW: 13.8 % (ref 11.5–15.5)
WBC: 4 10*3/uL (ref 4.0–10.5)
nRBC: 0 % (ref 0.0–0.2)

## 2018-05-23 MED ORDER — SODIUM CHLORIDE 0.9 % IV BOLUS
1000.0000 mL | Freq: Once | INTRAVENOUS | Status: AC
  Administered 2018-05-23: 11:00:00 1000 mL via INTRAVENOUS

## 2018-05-23 MED ORDER — PROCHLORPERAZINE EDISYLATE 10 MG/2ML IJ SOLN
10.0000 mg | Freq: Once | INTRAMUSCULAR | Status: AC
  Administered 2018-05-23: 11:00:00 10 mg via INTRAVENOUS
  Filled 2018-05-23: qty 2

## 2018-05-23 MED ORDER — DIPHENHYDRAMINE HCL 50 MG/ML IJ SOLN
25.0000 mg | Freq: Once | INTRAMUSCULAR | Status: AC
  Administered 2018-05-23: 25 mg via INTRAVENOUS
  Filled 2018-05-23: qty 1

## 2018-05-23 MED ORDER — HYDROCHLOROTHIAZIDE 25 MG PO TABS: 25.0000 mg | ORAL_TABLET | Freq: Every day | ORAL | 0 refills | Status: DC

## 2018-05-23 NOTE — ED Notes (Signed)
ED Provider at bedside discussing test results and dispo plan of care. 

## 2018-05-23 NOTE — ED Notes (Signed)
ED Provider at bedside. 

## 2018-05-23 NOTE — Discharge Instructions (Signed)
Work-up today is reassuring.  I suspect that your headaches may be related to your elevated blood pressure, and I would like to start you on once daily HCTZ.  This medication may make you feel that you need to go to the bathroom more frequently.  I would also like for you to start monitoring your blood pressure twice daily, once in the morning prior to taking your blood pressure medication and again in the evening, keep a log of this.  You will need to follow-up with a primary care doctor, please use the resources provided, so that they can continue to manage her blood pressure and change or add medications as needed.  Keeping a log of your blood pressures will help them do this.

## 2018-05-23 NOTE — ED Triage Notes (Signed)
Recurrent Headaches since playing football in HS and College.  Have been getting worse in the past 3 years.  Having Daily Headaches x6 months.  Does not have a pmd. Generally takes Aleve but it is not helping any more.
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
## 2019-01-25 ENCOUNTER — Emergency Department (HOSPITAL_COMMUNITY)
Admission: EM | Admit: 2019-01-25 | Discharge: 2019-01-25 | Disposition: A | Discharge status: Home / Self Care | Payer: BLUE CROSS/BLUE SHIELD | Attending: Emergency Medicine | Admitting: Emergency Medicine

## 2019-01-25 ENCOUNTER — Encounter (HOSPITAL_COMMUNITY): Payer: Self-pay | Admitting: *Deleted

## 2019-01-25 DIAGNOSIS — R202 Paresthesia of skin: Secondary | ICD-10-CM | POA: Insufficient documentation

## 2019-01-25 DIAGNOSIS — F17210 Nicotine dependence, cigarettes, uncomplicated: Secondary | ICD-10-CM | POA: Insufficient documentation

## 2019-01-25 DIAGNOSIS — M792 Neuralgia and neuritis, unspecified: Secondary | ICD-10-CM | POA: Insufficient documentation

## 2019-01-25 DIAGNOSIS — R200 Anesthesia of skin: Secondary | ICD-10-CM | POA: Insufficient documentation

## 2019-01-25 LAB — BASIC METABOLIC PANEL
Anion gap: 7 (ref 5–15)
BUN: 18 mg/dL (ref 6–20)
CO2: 23 mmol/L (ref 22–32)
Calcium: 8.7 mg/dL — ABNORMAL LOW (ref 8.9–10.3)
Chloride: 113 mmol/L — ABNORMAL HIGH (ref 98–111)
Creatinine, Ser: 1.12 mg/dL (ref 0.61–1.24)
GFR calc Af Amer: >60 mL/min (ref >60)
GFR calc non Af Amer: >60 mL/min (ref >60)
Glucose, Bld: 104 mg/dL — ABNORMAL HIGH (ref 70–99)
Potassium: 4 mmol/L (ref 3.5–5.1)
Sodium: 143 mmol/L (ref 135–145)

## 2019-01-25 LAB — CBC WITH DIFFERENTIAL/PLATELET
Abs Immature Granulocytes: 0.02 10*3/uL (ref 0.00–0.07)
Basophils Absolute: 0 10*3/uL (ref 0.0–0.1)
Basophils Relative: 1 %
Eosinophils Absolute: 0.1 10*3/uL (ref 0.0–0.5)
Eosinophils Relative: 2 %
HCT: 41.2 % (ref 39.0–52.0)
Hemoglobin: 13 g/dL (ref 13.0–17.0)
Immature Granulocytes: 0 %
Lymphocytes Relative: 36 %
Lymphs Abs: 2 10*3/uL (ref 0.7–4.0)
MCH: 29.2 pg (ref 26.0–34.0)
MCHC: 31.6 g/dL (ref 30.0–36.0)
MCV: 92.6 fL (ref 80.0–100.0)
Monocytes Absolute: 0.6 10*3/uL (ref 0.1–1.0)
Monocytes Relative: 11 %
Neutro Abs: 2.7 10*3/uL (ref 1.7–7.7)
Neutrophils Relative %: 50 %
Platelets: 232 10*3/uL (ref 150–400)
RBC: 4.45 MIL/uL (ref 4.22–5.81)
RDW: 14.2 % (ref 11.5–15.5)
WBC: 5.5 10*3/uL (ref 4.0–10.5)
nRBC: 0 % (ref 0.0–0.2)

## 2019-01-25 LAB — HEMOGLOBIN A1C
Hgb A1c MFr Bld: 6.1 % — ABNORMAL HIGH (ref 4.8–5.6)
Mean Plasma Glucose: 128.37 mg/dL

## 2019-01-25 MED ORDER — GABAPENTIN 100 MG PO CAPS: 100.0000 mg | ORAL_CAPSULE | Freq: Three times a day (TID) | ORAL | 0 refills | Status: DC

## 2019-01-25 NOTE — ED Triage Notes (Signed)
To ED for eval of bilateral upper leg pain and tingling/numbness to left 2 outter toes on left foot. States he was seen 2 weeks ago at [facility] and dx with plantar fascitis. Given treatment reg without relief. States since his visit at [facility] his legs have 'gone out' a couple of times. No difficulty with urination or defecation.  Denies trauma prior to this pain and numbness. No back pain.

## 2019-01-25 NOTE — ED Provider Notes (Signed)
[facility] EMERGENCY DEPARTMENT Provider Note   CSN: [id number] Arrival date & time: 01/25/19  1046     History Chief Complaint  Patient presents with  . Leg Pain    Travis Houston is a 54 y.o. male.  HPI   54 year old male with pain, numbness and tingling with his lower extremities. Bilateral, but worse in his left foot. Has been ongoing for months but worse in the last few weeks. Denies any acute trauma or strain. Describes different sensation including feeling "asleep, burning and prickly." Reports that he was recently told he may have plantar fasciitis. He reports that he has been doing the recommended therapies without improvement. He is not a diabetic that he is aware of. He does drinks "a beer or two" every day.   Past Medical History:  Diagnosis Date  . Asthma     There are no problems to display for this patient.   History reviewed. No pertinent surgical history.     No family history on file.  Social History   Tobacco Use  . Smoking status: Current Some Day Smoker    Packs/day: 0.50  . Smokeless tobacco: Never Used  Substance Use Topics  . Alcohol use: Yes    Comment: 3 beers /week  . Drug use: No    Home Medications Prior to Admission medications   Medication Sig Start Date End Date Taking? Authorizing Provider  hydrochlorothiazide (HYDRODIURIL) 25 MG tablet Take 1 tablet (25 mg total) by mouth daily. 05/23/18   Dartha Lodge, PA-C    Allergies    Patient has no known allergies.  Review of Systems   Review of Systems All systems reviewed and negative, other than as noted in HPI.  Physical Exam Updated Vital Signs BP (!) 155/89 (BP Location: Left Arm)   Pulse 87   Temp 98.8 F (37.1 C) (Oral)   Resp 18   SpO2 99%   Physical Exam Vitals and nursing note reviewed.  Constitutional:      General: He is not in acute distress.    Appearance: He is well-developed.  HENT:     Head: Normocephalic and atraumatic.  Eyes:   General:        Right eye: No discharge.        Left eye: No discharge.     Conjunctiva/sclera: Conjunctivae normal.  Cardiovascular:     Rate and Rhythm: Normal rate and regular rhythm.     Heart sounds: Normal heart sounds. No murmur. No friction rub. No gallop.   Pulmonary:     Effort: Pulmonary effort is normal. No respiratory distress.     Breath sounds: Normal breath sounds.  Abdominal:     General: There is no distension.     Palpations: Abdomen is soft.     Tenderness: There is no abdominal tenderness.  Musculoskeletal:        General: No tenderness.     Cervical back: Neck supple.     Comments: Onychomycosis. Lower extremity/feet otherwise unremarkable in appearance. He is good strength of the hips, knees and ankles. Sensation intact to light touch. Palpable DP pulses. No concerning skin changes noted.  Skin:    General: Skin is warm and dry.  Neurological:     Mental Status: He is alert.  Psychiatric:        Behavior: Behavior normal.        Thought Content: Thought content normal.     ED Results / Procedures / Treatments  Labs (all labs ordered are listed, but only abnormal results are displayed) Labs Reviewed  BASIC METABOLIC PANEL  HEMOGLOBIN A1C  CBC WITH DIFFERENTIAL/PLATELET    EKG None  Radiology No results found.  Procedures Procedures (including critical care time)  Medications Ordered in ED Medications - No data to display  ED Course  I have reviewed the triage vital signs and the nursing notes.  Pertinent labs & imaging results that were available during my care of the patient were reviewed by me and considered in my medical decision making (see chart for details).    MDM Rules/Calculators/A&P                     54 year old male with what I suspect is peripheral neuropathy based on his description. Exam fairly unremarkable for alternative etiology. He denies any back pain. No neurological findings on my exam. Noted history of diabetes  but he does not have regular medical care. He did have a normal nonfasting glucose on the BMP about 8 months ago. We will check some basic labs and A1c today. Could potentially be related to his alcohol use. States that he drinks daily and I suspect that he downplays actual quantity. Regardless, I doubt emergent process. We will treat symptomatically. Lifestyle modifications discussed.  Final Clinical Impression(s) / ED Diagnoses Final diagnoses:  Peripheral neuropathic pain    Rx / DC Orders ED Discharge Orders    None       Virgel Manifold, MD 01/31/19 [phone]

## 2019-02-13 ENCOUNTER — Other Ambulatory Visit: Payer: Self-pay

## 2019-02-13 ENCOUNTER — Encounter (HOSPITAL_BASED_OUTPATIENT_CLINIC_OR_DEPARTMENT_OTHER): Payer: Self-pay | Admitting: Emergency Medicine

## 2019-02-13 ENCOUNTER — Emergency Department (HOSPITAL_BASED_OUTPATIENT_CLINIC_OR_DEPARTMENT_OTHER)
Admission: EM | Admit: 2019-02-13 | Discharge: 2019-02-13 | Disposition: A | Discharge status: Home / Self Care | Payer: Self-pay | Attending: Emergency Medicine | Admitting: Emergency Medicine

## 2019-02-13 ENCOUNTER — Emergency Department (HOSPITAL_BASED_OUTPATIENT_CLINIC_OR_DEPARTMENT_OTHER): Payer: Self-pay

## 2019-02-13 DIAGNOSIS — M79672 Pain in left foot: Secondary | ICD-10-CM | POA: Insufficient documentation

## 2019-02-13 DIAGNOSIS — F17210 Nicotine dependence, cigarettes, uncomplicated: Secondary | ICD-10-CM | POA: Insufficient documentation

## 2019-02-13 DIAGNOSIS — J45909 Unspecified asthma, uncomplicated: Secondary | ICD-10-CM | POA: Insufficient documentation

## 2019-02-13 DIAGNOSIS — Z79899 Other long term (current) drug therapy: Secondary | ICD-10-CM | POA: Insufficient documentation

## 2019-02-13 LAB — CBG MONITORING, ED: Glucose-Capillary: 131 mg/dL — ABNORMAL HIGH (ref 70–99)

## 2019-02-13 MED ORDER — HYDROCODONE-ACETAMINOPHEN 5-325 MG PO TABS
1.0000 | ORAL_TABLET | Freq: Once | ORAL | Status: AC
  Administered 2019-02-13: 1 via ORAL
  Filled 2019-02-13: qty 1

## 2019-02-13 MED ORDER — GABAPENTIN 300 MG PO CAPS: 600.0000 mg | ORAL_CAPSULE | Freq: Three times a day (TID) | ORAL | 0 refills | Status: DC

## 2019-02-13 MED FILL — GABAPENTIN 300 MG CAPSULE: 300 | 10 days supply | Qty: 60 | Fill #0

## 2019-02-13 NOTE — Discharge Instructions (Signed)
Take the gabapentin as prescribed.  Follow-up with your primary care provider as scheduled.  You may also schedule appointment with the podiatrist.  I have listed his contact information in your discharge paperwork.

## 2019-02-13 NOTE — ED Triage Notes (Signed)
Pain in left foot for a couple weeks.  Sts he was seen at [facility] for it and diagnosed with Neuropathy.  Sts the pain is getting worse and he is not able to stand at work today.

## 2019-02-13 NOTE — ED Provider Notes (Signed)
[facility] EMERGENCY DEPARTMENT Provider Note   CSN: [id number] Arrival date & time: 02/13/19  5638     History Chief Complaint  Patient presents with   Foot Pain    Travis Houston is a 54 y.o. male with small history significant for asthma who presents for evaluation of foot pain.  Patient states he has had left foot pain x1 month.  He was prescribed gabapentin 3 weeks ago.  Patient states he has taken this however he continues to have pain.  Pain worse when he stands on his foot.  Patient states occasionally when he takes his shoe off and elevates his foot pain will resolve however then he describes a shooting, burning sensation in his toes.  His pain does not extend into his legs.  Located to all 5 of his digits.  Patient states he has cut back on his alcohol per previous provider's recommendation.  States he is only drinking 1-2 beers a week.  No withdrawal symptoms.  Patient denies any pain to his back or any radicular symptoms.  Denies any swelling, redness, warmth, color changes to his extremities.  He states he was standing at work today and his pain became so unbearable he proceeded to come to the emergency department for evaluation.  Denies facial droop, unilateral weakness, paresthesias.  Denies history IV drug use, bowel or bladder incontinence, saddle paresthesias.  He has been able to ambulate difficulty.  Rates his pain a 10/10.  Describes as a burning sensation.  Denies additional aggravating or alleviating factors.  Denies any acute trauma.  Pain will alternate between sharp sensation, burning, prickling.  Denies any history of diabetes.  He previously stated he drank a beer to daily however he is now denying this.  History obtained from patient and past medical records.  No interpreter is used.  HPI     Past Medical History:  Diagnosis Date   Asthma     There are no problems to display for this patient.   History reviewed. No pertinent surgical  history.     No family history on file.  Social History   Tobacco Use   Smoking status: Current Some Day Smoker    Packs/day: 0.50   Smokeless tobacco: Never Used  Substance Use Topics   Alcohol use: Yes    Comment: 1 beers /week   Drug use: No    Home Medications Prior to Admission medications   Medication Sig Start Date End Date Taking? Authorizing Provider  gabapentin (NEURONTIN) 300 MG capsule Take 2 capsules (600 mg total) by mouth 3 (three) times daily for 10 days. 02/13/19 02/23/19  Fatumata Kashani A, PA-C  hydrochlorothiazide (HYDRODIURIL) 25 MG tablet Take 1 tablet (25 mg total) by mouth daily. 05/23/18   Dartha Lodge, PA-C    Allergies    Patient has no known allergies.  Review of Systems   Review of Systems  Constitutional: Negative.   HENT: Negative.   Eyes: Negative.   Respiratory: Negative.   Cardiovascular: Negative.   Gastrointestinal: Negative.   Genitourinary: Negative.   Musculoskeletal: Negative.        Left foot pain  Skin: Negative.   Neurological: Positive for numbness. Negative for dizziness, tremors, seizures, syncope, facial asymmetry, speech difficulty, weakness, light-headedness and headaches.  All other systems reviewed and are negative.   Physical Exam Updated Vital Signs BP 129/83 (BP Location: Left Arm)    Pulse 90    Temp 98.2 F (36.8 C) (Oral)  Resp 20    Ht 5\' 5"  (1.651 m)    Wt 90.4 kg    SpO2 99%    BMI 33.15 kg/m   Physical Exam Vitals and nursing note reviewed.  Constitutional:      General: He is not in acute distress.    Appearance: He is well-developed. He is not ill-appearing, toxic-appearing or diaphoretic.  HENT:     Head: Normocephalic and atraumatic.     Nose: Nose normal.     Mouth/Throat:     Mouth: Mucous membranes are moist.     Pharynx: Oropharynx is clear.  Eyes:     Pupils: Pupils are equal, round, and reactive to light.  Cardiovascular:     Rate and Rhythm: Normal rate and regular rhythm.      Pulses: Normal pulses.          Dorsalis pedis pulses are 2+ on the right side and 2+ on the left side.       Posterior tibial pulses are 2+ on the right side and 2+ on the left side.     Heart sounds: Normal heart sounds.  Pulmonary:     Effort: Pulmonary effort is normal. No respiratory distress.     Breath sounds: Normal breath sounds.  Abdominal:     General: Bowel sounds are normal. There is no distension.     Palpations: Abdomen is soft. There is no mass.     Tenderness: There is no abdominal tenderness. There is no right CVA tenderness, left CVA tenderness, guarding or rebound.     Hernia: No hernia is present.  Musculoskeletal:        General: Normal range of motion.     Cervical back: Normal range of motion and neck supple.     Comments: Wiggles toes bilaterally.  Plantarflex, dorsiflex without difficulty.  No bony tenderness to spinal column, tibia, fibula or femur.  No bony tenderness to foot.  Feet:     Right foot:     Skin integrity: Skin integrity normal.     Toenail Condition: Right toenails are abnormally thick and long.     Left foot:     Skin integrity: Skin integrity normal.     Toenail Condition: Left toenails are abnormally thick and long.  Skin:    General: Skin is warm and dry.     Capillary Refill: Capillary refill takes less than 2 seconds.     Comments: No contusions, abrasions, lacerations, fluctuance, induration.  Tactile temperature to bilateral lower extremities.  Brisk capillary refill to digits.  No macerated skin between toes.  He does have some onychomycosis to his bilateral great toes.  Neurological:     General: No focal deficit present.     Mental Status: He is alert.     Sensory: Sensation is intact.     Motor: Motor function is intact.     Coordination: Coordination is intact.     Gait: Gait is intact.     Comments: Tach sensation to sharp and dull bilateral feet.  Able to ambulate without difficulty.5/5 strength bilateral lower extremities.     ED Results / Procedures / Treatments   Labs (all labs ordered are listed, but only abnormal results are displayed) Labs Reviewed  CBG MONITORING, ED - Abnormal; Notable for the following components:      Result Value   Glucose-Capillary 131 (*)    All other components within normal limits    EKG None  Radiology DG Foot Complete Left  Result Date: 02/13/2019 CLINICAL DATA:  Foot pain EXAM: LEFT FOOT - COMPLETE 3+ VIEW COMPARISON:  None. FINDINGS: No fracture or dislocation is seen. Mild degenerative changes of the 1st MTP joint. The visualized soft tissues are unremarkable. Small plantar and posterior calcaneal enthesophytes. IMPRESSION: Negative. Electronically Signed   By: Charline Bills M.D.   On: 02/13/2019 10:15    Procedures Procedures (including critical care time)  Medications Ordered in ED Medications  HYDROcodone-acetaminophen (NORCO/VICODIN) 5-325 MG per tablet 1 tablet (1 tablet Oral Given 02/13/19 1011)    ED Course  I have reviewed the triage vital signs and the nursing notes.  Pertinent labs & imaging results that were available during my care of the patient were reviewed by me and considered in my medical decision making (see chart for details).  54 year old male appears otherwise well presents for evaluation of left foot pain.  Pain is been occurring greater than 1 month.  No overlying skin changes.  He is neurovascularly intact.  He does have some onychomycosis to bilateral great toes however no pallor, temperature change to extremities.  No radicular symptoms.  No red flags for back pain.  Does have history of alcohol use however states he has "cut down."  He is now only drinking 1-2 beers a week.  No prior history of diabetes per patient however blood work obtained 3 weeks ago during prior emergency visit showed A1c of 6.1.  He does not have PCP follow-up however does have appointment to establish care middle February.  X-ray without any underlying  fracture, dislocation, osteomyelitis or gas-forming organism Blood sugar 131.  Discussed with attending Dr.Long does not think additional labs at this time are needed. Plan to up his Gabapentin to 600 TID and PCP follow up.  Patient likely with neuropathy.  He has full and equal pulses I have low suspicion for acute vascular claudication, infectious process, bony abnormality, septic joint, no diabetic ulcers/breaks in skin, no cellulitis, abscess.  No suspicion for tendon, ligament injury given he has full range of motion without difficulty.Marland Kitchen  He is ambulatory with out difficulty.  He is neurovascularly intact and has full range of motion.  Patient to follow-up with PCP.  Requesting referral to podiatry.  Will place.  Will increase his gabapentin per attending's recommendation.  Discussed return precautions.  Patient voiced understanding is agreeable for follow-up.    MDM Rules/Calculators/A&P                       Final Clinical Impression(s) / ED Diagnoses Final diagnoses:  Left foot pain    Rx / DC Orders ED Discharge Orders         Ordered    gabapentin (NEURONTIN) 300 MG capsule  3 times daily     02/13/19 1050           Kwali Wrinkle A, PA-C 02/13/19 1056    Long, Arlyss Repress, MD 02/14/19 1405

## 2019-03-02 DIAGNOSIS — G8918 Other acute postprocedural pain: Secondary | ICD-10-CM | POA: Insufficient documentation

## 2019-03-02 DIAGNOSIS — Z789 Other specified health status: Secondary | ICD-10-CM | POA: Insufficient documentation

## 2019-03-02 DIAGNOSIS — Z89512 Acquired absence of left leg below knee: Secondary | ICD-10-CM | POA: Insufficient documentation

## 2019-11-08 ENCOUNTER — Encounter: Payer: Self-pay | Admitting: Gastroenterology

## 2019-12-11 ENCOUNTER — Encounter: Payer: Self-pay | Admitting: *Deleted

## 2020-01-10 ENCOUNTER — Encounter: Payer: Self-pay | Admitting: Gastroenterology

## 2020-02-06 ENCOUNTER — Ambulatory Visit (AMBULATORY_SURGERY_CENTER): Payer: Medicaid Other

## 2020-02-06 ENCOUNTER — Other Ambulatory Visit: Payer: Self-pay

## 2020-02-06 VITALS — Ht 65.0 in | Wt 194.0 lb

## 2020-02-06 DIAGNOSIS — Z1211 Encounter for screening for malignant neoplasm of colon: Secondary | ICD-10-CM

## 2020-02-06 MED ORDER — NA SULFATE-K SULFATE-MG SULF 17.5-3.13-1.6 GM/177ML PO SOLN: 1.0000 | Freq: Once | ORAL | 0 refills | Status: AC

## 2020-02-06 NOTE — Progress Notes (Signed)
Pt verified name, DOB, address and insurance during PV today.   Pt mailed instruction packet to included paper to complete and mail back to [facility] with addressed and stamped envelope, copy of consent form to read and not return, and instructions. PV completed over the phone. Pt encouraged to call with questions or issues  No allergies to soy or egg Pt is not on blood thinners or diet pills Denies issues with sedation/intubation Denies atrial flutter/fib Denies constipation   Pt is aware of Covid safety and care partner requirements.   Pt denies Iron  Self report wt: 194 lb this past Friday.

## 2020-02-18 ENCOUNTER — Telehealth: Payer: Self-pay | Admitting: Gastroenterology

## 2020-02-18 NOTE — Telephone Encounter (Signed)
Spoke with the pharmacy. They ran the medication with his medicaid info and the suprep is $3 per pharmacist. Pt notified.

## 2020-02-18 NOTE — Telephone Encounter (Signed)
Pt is requesting a cheaper alternative for his SUPREP.

## 2020-02-20 ENCOUNTER — Telehealth: Payer: Self-pay | Admitting: Gastroenterology

## 2020-02-20 NOTE — Telephone Encounter (Signed)
Hi Dr. Myrtie Neither, this patient just called to cancel procedure that was scheduled on Friday 2/4 because he got up with Covid like symptoms and is getting tested today. Patient has rescheduled to 04/01/20 at 2:00pm. Thank you.

## 2020-02-20 NOTE — Telephone Encounter (Signed)
It is a screening exam, so OK to wait until he is ready at the later date. Thanks for the note.  - HD

## 2020-02-22 ENCOUNTER — Encounter: Payer: Self-pay | Admitting: Gastroenterology

## 2020-03-31 ENCOUNTER — Telehealth: Payer: Self-pay | Admitting: Gastroenterology

## 2020-03-31 NOTE — Telephone Encounter (Signed)
noted 

## 2020-03-31 NOTE — Telephone Encounter (Signed)
Patient called to reschedule his procedure scheduled for tomorrow said his leg was amputated and he can not walk rescheduled for 06/06/20.

## 2020-03-31 NOTE — Telephone Encounter (Signed)
Thank you for notifying me.  For clarification and documentation purposes, the patient's amputation surgery was in February 2021.

## 2020-04-01 ENCOUNTER — Encounter: Payer: Self-pay | Admitting: Gastroenterology

## 2020-05-18 DIAGNOSIS — U071 COVID-19: Secondary | ICD-10-CM

## 2020-05-18 HISTORY — DX: COVID-19: U07.1

## 2020-05-20 ENCOUNTER — Other Ambulatory Visit: Payer: Self-pay

## 2020-05-20 ENCOUNTER — Ambulatory Visit (AMBULATORY_SURGERY_CENTER): Payer: Medicaid Other

## 2020-05-20 VITALS — Ht 65.0 in | Wt 215.0 lb

## 2020-05-20 DIAGNOSIS — Z1211 Encounter for screening for malignant neoplasm of colon: Secondary | ICD-10-CM

## 2020-05-20 MED ORDER — NA SULFATE-K SULFATE-MG SULF 17.5-3.13-1.6 GM/177ML PO SOLN
1.0000 | Freq: Once | ORAL | 0 refills | Status: AC
Start: 2020-05-20 — End: 2020-05-20

## 2020-05-20 NOTE — Progress Notes (Signed)
No allergies to soy or egg Pt is not on blood thinners or diet pills Denies issues with sedation/intubation Denies atrial flutter/fib Denies constipation   Emmi instructions given to pt  Pt is aware of Covid safety and care partner requirements.  Pt verified name, DOB, address and insurance during PV today.   Pt mailed instruction packet to included copy of consent form to read and not return, and instructions. PV completed over the phone. Pt encouraged to call with questions or issues.  

## 2020-06-06 ENCOUNTER — Telehealth: Payer: Self-pay | Admitting: Gastroenterology

## 2020-06-06 ENCOUNTER — Encounter: Payer: Self-pay | Admitting: Gastroenterology

## 2020-06-06 NOTE — Telephone Encounter (Signed)
This patient canceled a colonoscopy in February due to reported illness.  Her canceled a colonoscopy on short notice again in March.  Today he did not show for his colonoscopy.  Typically, we give patients three cancels/no show before discharge from the practice.  I will give him one more chance to reschedule a colonoscopy on two conditions:  1 - it is an afternoon procedure slot.    2 - if he cancels in < 7 days or does not show, he will be discharged from our practice.  - H. Myrtie Neither, MD

## 2020-06-09 NOTE — Telephone Encounter (Signed)
Spoke with patient in regards to information below. Patient has been rescheduled for a virtual pre-visit with the nurse on Thursday, 07/17/20 at 8:30 AM. Colonoscopy has been rescheduled to Thursday, 08/07/20 at 4 PM, arriving at 3 PM with a care partner. Patient states that he had a lot going on and got cold feet. Patient states that he will be at this appointment. Patient verbalized understanding and had no concern at the end of the call. Letter mailed with updated appt information.

## 2020-07-18 DIAGNOSIS — R7303 Prediabetes: Secondary | ICD-10-CM

## 2020-07-18 HISTORY — DX: Prediabetes: R73.03

## 2020-08-07 ENCOUNTER — Encounter: Payer: Self-pay | Admitting: Gastroenterology

## 2020-09-08 ENCOUNTER — Other Ambulatory Visit (HOSPITAL_BASED_OUTPATIENT_CLINIC_OR_DEPARTMENT_OTHER): Payer: Self-pay | Admitting: Family Medicine

## 2020-09-08 ENCOUNTER — Ambulatory Visit (AMBULATORY_SURGERY_CENTER): Payer: Medicaid Other

## 2020-09-08 VITALS — Ht 65.0 in | Wt 208.0 lb

## 2020-09-08 DIAGNOSIS — Z1211 Encounter for screening for malignant neoplasm of colon: Secondary | ICD-10-CM

## 2020-09-08 DIAGNOSIS — R229 Localized swelling, mass and lump, unspecified: Secondary | ICD-10-CM

## 2020-09-08 NOTE — Progress Notes (Signed)
No egg or soy allergy known to patient  No issues with past sedation with any surgeries or procedures Patient denies ever being told they had issues or difficulty with intubation  No FH of Malignant Hyperthermia No diet pills per patient No home 02 use per patient  No blood thinners per patient  Pt denies issues with constipation  No A fib or A flutter  EMMI video to pt or via MyChart  COVID 19 guidelines implemented in PV today with Pt and RN   Pt is fully vaccinated  for Covid   Pt had covid in May.  Pt occasional use of marijuana, instructed to not use marijuana the day of procedure  Pt states he has his Suprep still from May.  Due to the COVID-19 pandemic we are asking patients to follow certain guidelines.  Pt aware of COVID protocols and [facility] guidelines

## 2020-09-23 ENCOUNTER — Other Ambulatory Visit: Payer: Self-pay

## 2020-09-23 ENCOUNTER — Encounter: Payer: Self-pay | Admitting: Gastroenterology

## 2020-09-23 ENCOUNTER — Ambulatory Visit (AMBULATORY_SURGERY_CENTER): Payer: Medicaid Other | Admitting: Gastroenterology

## 2020-09-23 VITALS — BP 137/99 | HR 95 | Temp 96.4°F | Resp 20 | Ht 65.0 in | Wt 208.0 lb

## 2020-09-23 DIAGNOSIS — Z1211 Encounter for screening for malignant neoplasm of colon: Secondary | ICD-10-CM

## 2020-09-23 MED ORDER — SODIUM CHLORIDE 0.9 % IV SOLN: 500.0000 mL | INTRAVENOUS | Status: DC

## 2020-09-23 NOTE — Op Note (Signed)
[facility] Patient Name: Travis Houston Procedure Date: 09/23/2020 9:20 AM MRN: [id number] Endoscopist: Sherilyn Cooter L. Myrtie Houston , MD Age: 55 Referring MD:  Date of Birth: 1965/12/22 Gender: Male Account #: [id number] Procedure:                Colonoscopy Indications:              Screening for colorectal malignant neoplasm, This                            is the patient's first colonoscopy Medicines:                Monitored Anesthesia Care Procedure:                Pre-Anesthesia Assessment:                           - Prior to the procedure, a History and Physical                            was performed, and patient medications and                            allergies were reviewed. The patient's tolerance of                            previous anesthesia was also reviewed. The risks                            and benefits of the procedure and the sedation                            options and risks were discussed with the patient.                            All questions were answered, and informed consent                            was obtained. Prior Anticoagulants: The patient has                            taken no previous anticoagulant or antiplatelet                            agents. ASA Grade Assessment: III - A patient with                            severe systemic disease. After reviewing the risks                            and benefits, the patient was deemed in                            satisfactory condition to undergo the procedure.  After obtaining informed consent, the colonoscope                            was passed under direct vision. Throughout the                            procedure, the patient's blood pressure, pulse, and                            oxygen saturations were monitored continuously. The                            Olympus CF-HQ190L (Serial# [id number]) Colonoscope was                            introduced through the anus  and advanced to the the                            terminal ileum, with identification of the                            appendiceal orifice and IC valve. The colonoscopy                            was performed without difficulty. The patient                            tolerated the procedure well. The quality of the                            bowel preparation was excellent. The ileocecal                            valve, appendiceal orifice, and rectum were                            photographed. The bowel preparation used was SUPREP. Scope In: 9:24:42 AM Scope Out: 9:36:57 AM Scope Withdrawal Time: 0 hours 8 minutes 57 seconds  Total Procedure Duration: 0 hours 12 minutes 15 seconds  Findings:                 The perianal and digital rectal examinations were                            normal.                           Internal hemorrhoids were found.                           The exam was otherwise without abnormality on                            direct and retroflexion views. Complications:            No immediate  complications. Estimated Blood Loss:     Estimated blood loss: none. Impression:               - Internal hemorrhoids.                           - The examination was otherwise normal on direct                            and retroflexion views.                           - No specimens collected. Recommendation:           - Patient has a contact number available for                            emergencies. The signs and symptoms of potential                            delayed complications were discussed with the                            patient. Return to normal activities tomorrow.                            Written discharge instructions were provided to the                            patient.                           - Resume previous diet.                           - Continue present medications.                           - Repeat colonoscopy in 10 years for  screening                            purposes. Travis Summer L. Myrtie Neither, MD 09/23/2020 9:40:35 AM This report has been signed electronically.

## 2020-09-23 NOTE — Patient Instructions (Signed)
Please read handouts provided. Continue present medications.   YOU HAD AN ENDOSCOPIC PROCEDURE TODAY AT THE [facility]:   Refer to the procedure report that was given to you for any specific questions about what was found during the examination.  If the procedure report does not answer your questions, please call your gastroenterologist to clarify.  If you requested that your care partner not be given the details of your procedure findings, then the procedure report has been included in a sealed envelope for you to review at your convenience later.  YOU SHOULD EXPECT: Some feelings of bloating in the abdomen. Passage of more gas than usual.  Walking can help get rid of the air that was put into your GI tract during the procedure and reduce the bloating. If you had a lower endoscopy (such as a colonoscopy or flexible sigmoidoscopy) you may notice spotting of blood in your stool or on the toilet paper. If you underwent a bowel prep for your procedure, you may not have a normal bowel movement for a few days.  Please Note:  You might notice some irritation and congestion in your nose or some drainage.  This is from the oxygen used during your procedure.  There is no need for concern and it should clear up in a day or so.  SYMPTOMS TO REPORT IMMEDIATELY:  Following lower endoscopy (colonoscopy or flexible sigmoidoscopy):  Excessive amounts of blood in the stool  Significant tenderness or worsening of abdominal pains  Swelling of the abdomen that is new, acute  Fever of 100F or higher   For urgent or emergent issues, a gastroenterologist can be reached at any hour by calling [phone]. Do not use MyChart messaging for urgent concerns.    DIET:  We do recommend a small meal at first, but then you may proceed to your regular diet.  Drink plenty of fluids but you should avoid alcoholic beverages for 24 hours.  ACTIVITY:  You should plan to take it easy for the rest of today and  you should NOT DRIVE or use heavy machinery until tomorrow (because of the sedation medicines used during the test).    FOLLOW UP: Our staff will call the number listed on your records 48-72 hours following your procedure to check on you and address any questions or concerns that you may have regarding the information given to you following your procedure. If we do not reach you, we will leave a message.  We will attempt to reach you two times.  During this call, we will ask if you have developed any symptoms of COVID 19. If you develop any symptoms (ie: fever, flu-like symptoms, shortness of breath, cough etc.) before then, please call [phone].  If you test positive for Covid 19 in the 2 weeks post procedure, please call and report this information to us.    If any biopsies were taken you will be contacted by phone or by letter within the next 1-3 weeks.  Please call us at [phone] if you have not heard about the biopsies in 3 weeks.    SIGNATURES/CONFIDENTIALITY: You and/or your care partner have signed paperwork which will be entered into your electronic medical record.  These signatures attest to the fact that that the information above on your After Visit Summary has been reviewed and is understood.  Full responsibility of the confidentiality of this discharge information lies with you and/or your care-partner.  

## 2020-09-23 NOTE — Progress Notes (Signed)
To pacu, VSS. Report to [email or web address] 

## 2020-09-23 NOTE — Progress Notes (Signed)
History and Physical:  This patient presents for endoscopic testing for: Encounter Diagnosis  Name Primary?   Special screening for malignant neoplasms, colon Yes   Patient without complaints today    Past Medical History: Past Medical History:  Diagnosis Date   Asthma    COVID-19 05/2020   Hyperlipidemia    Hypertension    Kidney stone    Pre-diabetes 07/2020     Past Surgical History: Past Surgical History:  Procedure Laterality Date   BELOW KNEE LEG AMPUTATION Left     Allergies: No Known Allergies  Outpatient Meds: Current Outpatient Medications  Medication Sig Dispense Refill   amitriptyline (ELAVIL) 25 MG tablet Take 1 tablet by mouth at bedtime.     aspirin 81 MG chewable tablet Chew by mouth daily.     atorvastatin (LIPITOR) 10 MG tablet take 1 tablet (10 mg) by oral route once daily for 90 days     cetirizine (ZYRTEC) 10 MG tablet Take 10 mg by mouth daily.     hydrochlorothiazide (HYDRODIURIL) 25 MG tablet Take 1 tablet (25 mg total) by mouth daily. 30 tablet 0   lisinopril (ZESTRIL) 10 MG tablet Take 10 mg by mouth daily.     gabapentin (NEURONTIN) 300 MG capsule Take 2 capsules (600 mg total) by mouth 3 (three) times daily for 10 days. 60 capsule 0   Current Facility-Administered Medications  Medication Dose Route Frequency Provider Last Rate Last Admin   0.9 %  sodium chloride infusion  500 mL Intravenous Continuous Danis, Starr Lake III, MD          ___________________________________________________________________ Objective   Exam:  BP (!) 140/91   Pulse 100   Temp (!) 96.4 F (35.8 C)   Ht 5\' 5"  (1.651 m)   Wt 208 lb (94.3 kg)   SpO2 97%   BMI 34.61 kg/m   CV: RRR without murmur, S1/S2 Resp: clear to auscultation bilaterally, normal RR and effort noted GI: soft, no tenderness, with active bowel sounds. Left BKA  Assessment: Encounter Diagnosis  Name Primary?   Special screening for malignant neoplasms, colon Yes      Plan: Colonoscopy  The benefits and risks of the planned procedure were described in detail with the patient or (when appropriate) their health care proxy.  Risks were outlined as including, but not limited to, bleeding, infection, perforation, adverse medication reaction leading to cardiac or pulmonary decompensation, pancreatitis (if ERCP).  The limitation of incomplete mucosal visualization was also discussed.  No guarantees or warranties were given.    The patient is appropriate for an endoscopic procedure in the ambulatory setting.   - , MD

## 2020-09-23 NOTE — Progress Notes (Signed)
Vs by cw.  

## 2020-09-25 ENCOUNTER — Telehealth: Payer: Self-pay

## 2020-09-25 ENCOUNTER — Telehealth: Payer: Self-pay | Admitting: *Deleted

## 2020-09-25 NOTE — Telephone Encounter (Signed)
First attempt follow up call to pt, lm for pt to call if having any problems or questions, otherwise we will call them back later this morning or early this afternoon.  °

## 2020-09-25 NOTE — Telephone Encounter (Signed)
  Follow up Call-  Call back number 09/23/2020  Post procedure Call Back phone  # [phone]  Permission to leave phone message Yes  Some recent data might be hidden     No answer at 2nd attempt follow up phone call.  Left message on voicemail.

## 2020-10-02 ENCOUNTER — Telehealth (HOSPITAL_BASED_OUTPATIENT_CLINIC_OR_DEPARTMENT_OTHER): Payer: Self-pay

## 2020-10-02 ENCOUNTER — Other Ambulatory Visit (HOSPITAL_BASED_OUTPATIENT_CLINIC_OR_DEPARTMENT_OTHER): Payer: Self-pay | Admitting: Family Medicine

## 2020-10-02 DIAGNOSIS — M79604 Pain in right leg: Secondary | ICD-10-CM

## 2020-10-02 DIAGNOSIS — R229 Localized swelling, mass and lump, unspecified: Secondary | ICD-10-CM

## 2020-10-14 ENCOUNTER — Ambulatory Visit (HOSPITAL_BASED_OUTPATIENT_CLINIC_OR_DEPARTMENT_OTHER)
Admission: RE | Admit: 2020-10-14 | Discharge: 2020-10-14 | Disposition: A | Discharge status: Home / Self Care | Payer: Medicaid Other | Source: Ambulatory Visit | Attending: Family Medicine | Admitting: Family Medicine

## 2020-10-14 ENCOUNTER — Other Ambulatory Visit (HOSPITAL_BASED_OUTPATIENT_CLINIC_OR_DEPARTMENT_OTHER): Payer: Self-pay

## 2020-10-14 ENCOUNTER — Ambulatory Visit: Payer: Medicaid Other | Attending: Internal Medicine

## 2020-10-14 ENCOUNTER — Other Ambulatory Visit: Payer: Self-pay

## 2020-10-14 DIAGNOSIS — M79604 Pain in right leg: Secondary | ICD-10-CM | POA: Insufficient documentation

## 2020-10-14 DIAGNOSIS — R229 Localized swelling, mass and lump, unspecified: Secondary | ICD-10-CM | POA: Insufficient documentation

## 2020-10-14 DIAGNOSIS — Z23 Encounter for immunization: Secondary | ICD-10-CM

## 2020-10-14 MED ORDER — INFLUENZA VAC SPLIT QUAD 0.5 ML IM SUSY
PREFILLED_SYRINGE | INTRAMUSCULAR | 0 refills | Status: DC
  Filled 2020-10-14: qty 0.5, 1d supply, fill #0

## 2020-10-14 NOTE — Progress Notes (Signed)
   Covid-19 Vaccination Clinic  Name:  Travis Houston    MRN: [id number] DOB: 12/02/1965  10/14/2020  Travis Houston was observed post Covid-19 immunization for 15 minutes without incident. He was provided with Vaccine Information Sheet and instruction to access the V-Safe system.   Travis Houston was instructed to call 911 with any severe reactions post vaccine: Difficulty breathing  Swelling of face and throat  A fast heartbeat  A bad rash all over body  Dizziness and weakness   Immunizations Administered     Name Date Dose VIS Date Route   PFIZER Comrnaty(Gray TOP) Covid-19 Vaccine 10/14/2020 10:01 AM 0.3 mL 09/17/2020 Intramuscular   Manufacturer: [employer], [employer]   Lot: [id number]   NDC: [phone]

## 2020-10-24 ENCOUNTER — Other Ambulatory Visit (HOSPITAL_BASED_OUTPATIENT_CLINIC_OR_DEPARTMENT_OTHER): Payer: Self-pay

## 2020-10-24 MED ORDER — COVID-19MRNA BIVAL VACC PFIZER 30 MCG/0.3ML IM SUSP
INTRAMUSCULAR | 0 refills | Status: DC
  Filled 2020-10-24: qty 0.3, 1d supply, fill #0

## 2020-12-19 ENCOUNTER — Emergency Department (HOSPITAL_BASED_OUTPATIENT_CLINIC_OR_DEPARTMENT_OTHER): Payer: Medicaid Other

## 2020-12-19 ENCOUNTER — Other Ambulatory Visit: Payer: Self-pay

## 2020-12-19 ENCOUNTER — Emergency Department (HOSPITAL_COMMUNITY): Payer: Medicaid Other

## 2020-12-19 ENCOUNTER — Emergency Department (HOSPITAL_BASED_OUTPATIENT_CLINIC_OR_DEPARTMENT_OTHER)
Admission: EM | Admit: 2020-12-19 | Discharge: 2020-12-19 | Disposition: A | Discharge status: Home / Self Care | Payer: Medicaid Other | Attending: Emergency Medicine | Admitting: Emergency Medicine

## 2020-12-19 ENCOUNTER — Encounter (HOSPITAL_BASED_OUTPATIENT_CLINIC_OR_DEPARTMENT_OTHER): Payer: Self-pay

## 2020-12-19 DIAGNOSIS — Z79899 Other long term (current) drug therapy: Secondary | ICD-10-CM | POA: Insufficient documentation

## 2020-12-19 DIAGNOSIS — Z8616 Personal history of COVID-19: Secondary | ICD-10-CM | POA: Diagnosis not present

## 2020-12-19 DIAGNOSIS — Z7982 Long term (current) use of aspirin: Secondary | ICD-10-CM | POA: Diagnosis not present

## 2020-12-19 DIAGNOSIS — M79601 Pain in right arm: Secondary | ICD-10-CM | POA: Insufficient documentation

## 2020-12-19 DIAGNOSIS — R079 Chest pain, unspecified: Secondary | ICD-10-CM | POA: Insufficient documentation

## 2020-12-19 DIAGNOSIS — J45909 Unspecified asthma, uncomplicated: Secondary | ICD-10-CM | POA: Diagnosis not present

## 2020-12-19 DIAGNOSIS — E860 Dehydration: Secondary | ICD-10-CM | POA: Insufficient documentation

## 2020-12-19 DIAGNOSIS — F17210 Nicotine dependence, cigarettes, uncomplicated: Secondary | ICD-10-CM | POA: Diagnosis not present

## 2020-12-19 DIAGNOSIS — N12 Tubulo-interstitial nephritis, not specified as acute or chronic: Secondary | ICD-10-CM | POA: Diagnosis not present

## 2020-12-19 DIAGNOSIS — M791 Myalgia, unspecified site: Secondary | ICD-10-CM | POA: Insufficient documentation

## 2020-12-19 DIAGNOSIS — Z20822 Contact with and (suspected) exposure to covid-19: Secondary | ICD-10-CM | POA: Diagnosis not present

## 2020-12-19 DIAGNOSIS — R112 Nausea with vomiting, unspecified: Secondary | ICD-10-CM | POA: Diagnosis present

## 2020-12-19 DIAGNOSIS — R52 Pain, unspecified: Secondary | ICD-10-CM

## 2020-12-19 DIAGNOSIS — I10 Essential (primary) hypertension: Secondary | ICD-10-CM | POA: Diagnosis not present

## 2020-12-19 DIAGNOSIS — R000 Tachycardia, unspecified: Secondary | ICD-10-CM | POA: Insufficient documentation

## 2020-12-19 DIAGNOSIS — M79602 Pain in left arm: Secondary | ICD-10-CM | POA: Diagnosis not present

## 2020-12-19 LAB — CBC
HCT: 47.3 % (ref 39.0–52.0)
Hemoglobin: 15.9 g/dL (ref 13.0–17.0)
MCH: 29.6 pg (ref 26.0–34.0)
MCHC: 33.6 g/dL (ref 30.0–36.0)
MCV: 88.1 fL (ref 80.0–100.0)
Platelets: 251 10*3/uL (ref 150–400)
RBC: 5.37 MIL/uL (ref 4.22–5.81)
RDW: 14.2 % (ref 11.5–15.5)
WBC: 11.1 10*3/uL — ABNORMAL HIGH (ref 4.0–10.5)
nRBC: 0 % (ref 0.0–0.2)

## 2020-12-19 LAB — HEPATIC FUNCTION PANEL
ALT: 50 U/L — ABNORMAL HIGH (ref 0–44)
AST: 33 U/L (ref 15–41)
Albumin: 3.2 g/dL — ABNORMAL LOW (ref 3.5–5.0)
Alkaline Phosphatase: 54 U/L (ref 38–126)
Bilirubin, Direct: <0.1 mg/dL (ref 0.0–0.2)
Total Bilirubin: 0.3 mg/dL (ref 0.3–1.2)
Total Protein: 6.9 g/dL (ref 6.5–8.1)

## 2020-12-19 LAB — TROPONIN I (HIGH SENSITIVITY)
Troponin I (High Sensitivity): 7 ng/L (ref <18)
Troponin I (High Sensitivity): 6 ng/L (ref <18)

## 2020-12-19 LAB — BASIC METABOLIC PANEL
Anion gap: 11 (ref 5–15)
BUN: 13 mg/dL (ref 6–20)
CO2: 23 mmol/L (ref 22–32)
Calcium: 8.7 mg/dL — ABNORMAL LOW (ref 8.9–10.3)
Chloride: 100 mmol/L (ref 98–111)
Creatinine, Ser: 1.35 mg/dL — ABNORMAL HIGH (ref 0.61–1.24)
GFR, Estimated: >60 mL/min (ref >60)
Glucose, Bld: 137 mg/dL — ABNORMAL HIGH (ref 70–99)
Potassium: 4 mmol/L (ref 3.5–5.1)
Sodium: 134 mmol/L — ABNORMAL LOW (ref 135–145)

## 2020-12-19 LAB — RESP PANEL BY RT-PCR (FLU A&B, COVID) ARPGX2
Influenza A by PCR: NEGATIVE
Influenza B by PCR: NEGATIVE
SARS Coronavirus 2 by RT PCR: NEGATIVE

## 2020-12-19 LAB — PHOSPHORUS: Phosphorus: 3.7 mg/dL (ref 2.5–4.6)

## 2020-12-19 LAB — MAGNESIUM: Magnesium: 2.4 mg/dL (ref 1.7–2.4)

## 2020-12-19 LAB — D-DIMER, QUANTITATIVE: D-Dimer, Quant: 1.5 ug/mL-FEU — ABNORMAL HIGH (ref 0.00–0.50)

## 2020-12-19 MED ORDER — IOHEXOL 350 MG/ML SOLN
100.0000 mL | Freq: Once | INTRAVENOUS | Status: AC | PRN
  Administered 2020-12-19: 100 mL via INTRAVENOUS

## 2020-12-19 MED ORDER — CEPHALEXIN 500 MG PO CAPS: 500.0000 mg | ORAL_CAPSULE | Freq: Three times a day (TID) | ORAL | 0 refills | Status: AC

## 2020-12-19 MED ORDER — LACTATED RINGERS IV BOLUS
1000.0000 mL | Freq: Once | INTRAVENOUS | Status: AC
  Administered 2020-12-19: 1000 mL via INTRAVENOUS

## 2020-12-19 NOTE — ED Notes (Signed)
Patient discharged to home.  All discharge instructions reviewed.  Patient verbalized understanding via teachback method.  VS WDL.  Respirations even and unlabored.  Ambulatory out of ED.   °

## 2020-12-19 NOTE — ED Triage Notes (Addendum)
Pt c/o muscle spasms/cramps "all over my whole body", intermittent CP-sx started after giving plasma 11/29-NAD-to triage in w/c

## 2020-12-19 NOTE — ED Provider Notes (Signed)
[facility] Provider Note   CSN: [id number] Arrival date & time: 12/19/20  1400     History Chief Complaint  Patient presents with   Spasms   Chest Pain    Travis Houston is a 55 y.o. male.  HPI     Started early November donating plasma once a week, donated 3 times, every time get up feels drained and miserable, had some cramping last week, over the last 3-4 days has gotten worse, last time donating was 11/20 It has been getting much worse since then, cramping bilateral arms, back, and chest, can't rest due to muscle aches and cramping Tried pickle juice, V8, water, drank a lot of water last night and still cramping No fever Shortness of breath earlier today, now feeling a little better Pain 6/10, worse with deep breaths pain in back with that If turn then cramps on the other side Nausea earlier today, vomiting Monday and Tuesday, diarrhea Tuesday night, no black or bloody stools, diarrhea and vomiting better, havent had anything.  Low appetite, barely had anything to eat No cough, congestion, sore throat No known sick contacts  Was around people at thanksgiving Yes cigarettes,   Past Medical History:  Diagnosis Date   Asthma    COVID-19 05/2020   Hyperlipidemia    Hypertension    Kidney stone    Pre-diabetes 07/2020    Patient Active Problem List   Diagnosis Date Noted   Impaired mobility and activities of daily living 03/02/2019   Postoperative pain 03/02/2019   S/P BKA (below knee amputation) unilateral, left ([place]) 03/02/2019    Past Surgical History:  Procedure Laterality Date   BELOW KNEE LEG AMPUTATION Left        Family History  Problem Relation Age of Onset   Colon cancer Neg Hx    Colon polyps Neg Hx    Esophageal cancer Neg Hx    Rectal cancer Neg Hx    Stomach cancer Neg Hx     Social History   Tobacco Use   Smoking status: Every Day    Packs/day: 0.50    Types: Cigarettes   Smokeless tobacco: Never   Vaping Use   Vaping Use: Never used  Substance Use Topics   Alcohol use: Yes    Comment: occ   Drug use: Yes    Types: Marijuana    Home Medications Prior to Admission medications   Medication Sig Start Date End Date Taking? Authorizing Provider  cephALEXin (KEFLEX) 500 MG capsule Take 1 capsule (500 mg total) by mouth 3 (three) times daily for 7 days. 12/19/20 12/26/20 Yes Gareth Morgan, MD  amitriptyline (ELAVIL) 25 MG tablet Take 1 tablet by mouth at bedtime. 03/13/19 09/23/20  [provider]  aspirin 81 MG chewable tablet Chew by mouth daily.    [provider]  atorvastatin (LIPITOR) 10 MG tablet take 1 tablet (10 mg) by oral route once daily for 90 days 02/01/20   [provider]  cetirizine (ZYRTEC) 10 MG tablet Take 10 mg by mouth daily. 05/21/20   [provider]  COVID-19 mRNA bivalent vaccine, Pfizer, injection Inject into the muscle. 10/14/20   Carlyle Basques, MD  gabapentin (NEURONTIN) 300 MG capsule Take 2 capsules (600 mg total) by mouth 3 (three) times daily for 10 days. 02/13/19 02/23/19  Henderly, Britni A, PA-C  hydrochlorothiazide (HYDRODIURIL) 25 MG tablet Take 1 tablet (25 mg total) by mouth daily. 05/23/18   Jacqlyn Larsen, PA-C  influenza  vac split quadrivalent PF (FLUARIX) 0.5 ML injection Inject into the muscle. 10/14/20   Carlyle Basques, MD  lisinopril (ZESTRIL) 10 MG tablet Take 10 mg by mouth daily. 09/05/20   [provider]    Allergies    Patient has no known allergies.  Review of Systems   Review of Systems  Constitutional:  Positive for appetite change and fatigue. Negative for fever.  HENT:  Negative for sore throat.   Eyes:  Negative for visual disturbance.  Respiratory:  Positive for shortness of breath.   Cardiovascular:  Positive for chest pain.  Gastrointestinal:  Positive for nausea. Negative for abdominal pain and vomiting (now resolved). Diarrhea: now resolved. Genitourinary:  Negative for difficulty  urinating.  Musculoskeletal:  Positive for myalgias. Negative for back pain and neck stiffness.  Skin:  Negative for rash.  Neurological:  Negative for syncope and headaches.   Physical Exam Updated Vital Signs BP 118/64 (BP Location: Right Arm)   Pulse 98   Temp 98.1 F (36.7 C) (Oral)   Resp (!) 21   Ht 5\' 5"  (1.651 m)   Wt 91.6 kg   SpO2 100%   BMI 33.61 kg/m   Physical Exam Vitals and nursing note reviewed.  Constitutional:      General: He is not in acute distress.    Appearance: He is well-developed. He is not diaphoretic.  HENT:     Head: Normocephalic and atraumatic.  Eyes:     Conjunctiva/sclera: Conjunctivae normal.  Cardiovascular:     Rate and Rhythm: Regular rhythm. Tachycardia present.     Heart sounds: Normal heart sounds. No murmur heard.   No friction rub. No gallop.  Pulmonary:     Effort: Pulmonary effort is normal. No respiratory distress.     Breath sounds: Normal breath sounds. No wheezing or rales.  Abdominal:     General: There is no distension.     Palpations: Abdomen is soft.     Tenderness: There is no abdominal tenderness. There is no guarding.  Musculoskeletal:     Cervical back: Normal range of motion.  Skin:    General: Skin is warm and dry.  Neurological:     Mental Status: He is alert and oriented to person, place, and time.    ED Results / Procedures / Treatments   Labs (all labs ordered are listed, but only abnormal results are displayed) Labs Reviewed  BASIC METABOLIC PANEL - Abnormal; Notable for the following components:      Result Value   Sodium 134 (*)    Glucose, Bld 137 (*)    Creatinine, Ser 1.35 (*)    Calcium 8.7 (*)    All other components within normal limits  CBC - Abnormal; Notable for the following components:   WBC 11.1 (*)    All other components within normal limits  HEPATIC FUNCTION PANEL - Abnormal; Notable for the following components:   Albumin 3.2 (*)    ALT 50 (*)    All other components within  normal limits  D-DIMER, QUANTITATIVE - Abnormal; Notable for the following components:   D-Dimer, Quant 1.50 (*)    All other components within normal limits  RESP PANEL BY RT-PCR (FLU A&B, COVID) [id number]  URINE CULTURE  MAGNESIUM  PHOSPHORUS  TROPONIN I (HIGH SENSITIVITY)  TROPONIN I (HIGH SENSITIVITY)    EKG EKG Interpretation  Date/Time:  Friday December 19 2020 14:20:13 EST Ventricular Rate:  115 PR Interval:  150 QRS Duration: 82 QT Interval:  320 QTC Calculation: 442 R Axis:   -80 Text Interpretation: Sinus tachycardia Right atrial enlargement Left axis deviation Minimal voltage criteria for LVH, may be normal variant ( R in aVL ) Inferior infarct , age undetermined Cannot rule out Anterior infarct , age undetermined Abnormal ECG Since prior ECG, rate has increased Confirmed by Alvira Monday ([id number]) on 12/19/2020 3:16:52 PM  Radiology DG Chest 2 View  Result Date: 12/19/2020 CLINICAL DATA:  Chest pain EXAM: CHEST - 2 VIEW COMPARISON:  Chest x-ray 03/14/2014 FINDINGS: Heart size and mediastinal contours are within normal limits. No suspicious pulmonary opacities identified. No pleural effusion or pneumothorax visualized. No acute osseous abnormality appreciated. IMPRESSION: No acute intrathoracic process identified. Electronically Signed   By: Jannifer Hick M.D.   On: 12/19/2020 14:56   CT Angio Chest PE W and/or Wo Contrast  Result Date: 12/19/2020 CLINICAL DATA:  Intermittent chest pain EXAM: CT ANGIOGRAPHY CHEST WITH CONTRAST TECHNIQUE: Multidetector CT imaging of the chest was performed using the standard protocol during bolus administration of intravenous contrast. Multiplanar CT image reconstructions and MIPs were obtained to evaluate the vascular anatomy. CONTRAST:  100 cc of Omnipaque 350 COMPARISON:  CT February 25, 2019 and February 23, 2019. FINDINGS: Cardiovascular: Satisfactory opacification of the pulmonary arteries to the segmental level. No evidence of pulmonary  embolism. Normal heart size. No pericardial effusion. Mediastinum/Nodes: No enlarged mediastinal, hilar, or axillary lymph nodes. Thyroid gland, trachea, and esophagus demonstrate no significant findings. Lungs/Pleura: Lungs are clear. No pleural effusion or pneumothorax. Upper Abdomen: Heterogeneous area of enhancement in the upper pole the left kidney, coronal image 74/8 with adjacent perinephric stranding. Musculoskeletal: Mild thoracic spondylosis. No acute osseous abnormality. Review of the MIP images confirms the above findings. IMPRESSION: 1. No evidence of pulmonary embolism or other acute intrathoracic process. 2. Heterogeneous area of enhancement in the upper pole of the left kidney with adjacent inflammatory stranding, which is suspicious for focal pyelonephritis. Recommend follow-up CT abdomen and pelvis with and without contrast following completion of therapy to ensure resolution. Electronically Signed   By: Maudry Mayhew M.D.   On: 12/19/2020 19:36    Procedures Procedures   Medications Ordered in ED Medications  lactated ringers bolus 1,000 mL (0 mLs Intravenous Stopped 12/19/20 1926)  iohexol (OMNIPAQUE) 350 MG/ML injection 100 mL (100 mLs Intravenous Contrast Given 12/19/20 1835)    ED Course  I have reviewed the triage vital signs and the nursing notes.  Pertinent labs & imaging results that were available during my care of the patient were reviewed by me and considered in my medical decision making (see chart for details).    MDM Rules/Calculators/A&P                           55yo male with history of asthma, hypertension, hyperlipidemia, prediabetes, BKA secondary to peripheral artery disease per history from patient, who presents with concern for diffuse body aches, chest pain, dyspnea after donating plasma 3 times this month.  Differential diagnosis includes citrate toxicity, electrolyte abnormalities, ACS, pulmonary embolus, pneumonia, viral syndrome,  dehydration.  Arrives to the emergency department with sinus tachycardia with a rate in the 120s.  Corrected Ca WNL, normal Mg, normal phos.  Flu/COVID testing negative.  Delta troponins negative and low suspicoin for ACS by history, duration of symptoms.  Chest x-ray without pneumonia or pulmonary edema.    D-dimer positive and CT PE study was obtained which shows no acute findings.  Do  suspect some dehydration given recent plasma donation, nausea, vomiting, diarrhea, given IV fluid with improvement of heart rate.  Possible other viral etiology of symptoms as well as dehydration. Vital signs improved with hydration in the ED. Patient discharged in stable condition with understanding of reasons to return.   Final Clinical Impression(s) / ED Diagnoses Final diagnoses:  Body aches  Pyelonephritis  Dehydration    Rx / DC Orders ED Discharge Orders          Ordered    cephALEXin (KEFLEX) 500 MG capsule  3 times daily        12/19/20 2018             Gareth Morgan, MD 12/20/20 [phone]

## 2020-12-19 NOTE — ED Notes (Signed)
CT waiting on new IV for angio study

## 2020-12-21 LAB — URINE CULTURE: Culture: NO GROWTH

## 2020-12-25 ENCOUNTER — Other Ambulatory Visit (HOSPITAL_BASED_OUTPATIENT_CLINIC_OR_DEPARTMENT_OTHER): Payer: Self-pay | Admitting: Family Medicine

## 2021-01-02 ENCOUNTER — Other Ambulatory Visit (HOSPITAL_BASED_OUTPATIENT_CLINIC_OR_DEPARTMENT_OTHER): Payer: Self-pay | Admitting: Family Medicine

## 2021-01-02 DIAGNOSIS — N269 Renal sclerosis, unspecified: Secondary | ICD-10-CM

## 2021-01-02 DIAGNOSIS — R93429 Abnormal radiologic findings on diagnostic imaging of unspecified kidney: Secondary | ICD-10-CM

## 2021-01-05 ENCOUNTER — Ambulatory Visit (HOSPITAL_BASED_OUTPATIENT_CLINIC_OR_DEPARTMENT_OTHER): Payer: Medicaid Other

## 2021-01-06 ENCOUNTER — Telehealth (HOSPITAL_BASED_OUTPATIENT_CLINIC_OR_DEPARTMENT_OTHER): Payer: Self-pay

## 2021-01-07 ENCOUNTER — Telehealth (HOSPITAL_BASED_OUTPATIENT_CLINIC_OR_DEPARTMENT_OTHER): Payer: Self-pay

## 2021-01-07 ENCOUNTER — Ambulatory Visit (HOSPITAL_BASED_OUTPATIENT_CLINIC_OR_DEPARTMENT_OTHER): Payer: Medicaid Other

## 2021-01-15 ENCOUNTER — Other Ambulatory Visit: Payer: Self-pay

## 2021-01-15 ENCOUNTER — Other Ambulatory Visit (HOSPITAL_BASED_OUTPATIENT_CLINIC_OR_DEPARTMENT_OTHER): Payer: Self-pay | Admitting: Family Medicine

## 2021-01-15 ENCOUNTER — Ambulatory Visit (HOSPITAL_BASED_OUTPATIENT_CLINIC_OR_DEPARTMENT_OTHER)
Admission: RE | Admit: 2021-01-15 | Discharge: 2021-01-15 | Disposition: A | Discharge status: Home / Self Care | Payer: Medicaid Other | Source: Ambulatory Visit | Attending: Family Medicine | Admitting: Family Medicine

## 2021-01-15 ENCOUNTER — Encounter (HOSPITAL_BASED_OUTPATIENT_CLINIC_OR_DEPARTMENT_OTHER): Payer: Self-pay

## 2021-01-15 DIAGNOSIS — N269 Renal sclerosis, unspecified: Secondary | ICD-10-CM

## 2021-01-15 DIAGNOSIS — R93429 Abnormal radiologic findings on diagnostic imaging of unspecified kidney: Secondary | ICD-10-CM | POA: Insufficient documentation

## 2021-01-15 MED ORDER — IOHEXOL 300 MG/ML  SOLN
100.0000 mL | Freq: Once | INTRAMUSCULAR | Status: AC | PRN
  Administered 2021-01-15: 08:00:00 100 mL via INTRAVENOUS

## 2021-05-07 ENCOUNTER — Other Ambulatory Visit: Payer: Self-pay | Admitting: Nephrology

## 2021-05-07 DIAGNOSIS — N280 Ischemia and infarction of kidney: Secondary | ICD-10-CM

## 2021-05-18 ENCOUNTER — Ambulatory Visit
Admission: RE | Admit: 2021-05-18 | Discharge: 2021-05-18 | Disposition: A | Discharge status: Home / Self Care | Payer: Medicare Other | Source: Ambulatory Visit | Attending: Nephrology | Admitting: Nephrology

## 2021-05-18 DIAGNOSIS — N280 Ischemia and infarction of kidney: Secondary | ICD-10-CM

## 2021-05-25 ENCOUNTER — Inpatient Hospital Stay: Payer: Medicare Other | Attending: Hematology & Oncology

## 2021-05-25 ENCOUNTER — Inpatient Hospital Stay: Payer: Medicare Other | Admitting: Hematology & Oncology

## 2021-06-08 ENCOUNTER — Other Ambulatory Visit: Payer: Self-pay | Admitting: Family

## 2021-06-08 DIAGNOSIS — D6859 Other primary thrombophilia: Secondary | ICD-10-CM

## 2021-06-08 DIAGNOSIS — I749 Embolism and thrombosis of unspecified artery: Secondary | ICD-10-CM

## 2021-06-09 ENCOUNTER — Inpatient Hospital Stay: Payer: Medicare Other | Admitting: Family

## 2021-06-09 ENCOUNTER — Inpatient Hospital Stay: Payer: Medicare Other

## 2021-06-19 ENCOUNTER — Other Ambulatory Visit: Payer: Self-pay

## 2021-06-19 ENCOUNTER — Inpatient Hospital Stay: Payer: Medicare Other | Attending: Hematology & Oncology

## 2021-06-19 ENCOUNTER — Inpatient Hospital Stay (HOSPITAL_BASED_OUTPATIENT_CLINIC_OR_DEPARTMENT_OTHER): Payer: Medicare Other | Admitting: Family

## 2021-06-19 ENCOUNTER — Encounter: Payer: Self-pay | Admitting: Family

## 2021-06-19 VITALS — BP 138/87 | HR 92 | Temp 97.8°F | Resp 18 | Ht 65.0 in | Wt 209.1 lb

## 2021-06-19 DIAGNOSIS — Z7982 Long term (current) use of aspirin: Secondary | ICD-10-CM | POA: Diagnosis not present

## 2021-06-19 DIAGNOSIS — I701 Atherosclerosis of renal artery: Secondary | ICD-10-CM | POA: Insufficient documentation

## 2021-06-19 DIAGNOSIS — Z87442 Personal history of urinary calculi: Secondary | ICD-10-CM | POA: Diagnosis not present

## 2021-06-19 DIAGNOSIS — J45909 Unspecified asthma, uncomplicated: Secondary | ICD-10-CM | POA: Diagnosis not present

## 2021-06-19 DIAGNOSIS — F17210 Nicotine dependence, cigarettes, uncomplicated: Secondary | ICD-10-CM | POA: Diagnosis not present

## 2021-06-19 DIAGNOSIS — D573 Sickle-cell trait: Secondary | ICD-10-CM

## 2021-06-19 DIAGNOSIS — I10 Essential (primary) hypertension: Secondary | ICD-10-CM | POA: Insufficient documentation

## 2021-06-19 DIAGNOSIS — Z86718 Personal history of other venous thrombosis and embolism: Secondary | ICD-10-CM | POA: Diagnosis present

## 2021-06-19 DIAGNOSIS — Z79899 Other long term (current) drug therapy: Secondary | ICD-10-CM | POA: Diagnosis not present

## 2021-06-19 DIAGNOSIS — D6859 Other primary thrombophilia: Secondary | ICD-10-CM

## 2021-06-19 DIAGNOSIS — Z8616 Personal history of COVID-19: Secondary | ICD-10-CM | POA: Diagnosis not present

## 2021-06-19 DIAGNOSIS — E785 Hyperlipidemia, unspecified: Secondary | ICD-10-CM | POA: Diagnosis not present

## 2021-06-19 DIAGNOSIS — I749 Embolism and thrombosis of unspecified artery: Secondary | ICD-10-CM

## 2021-06-19 LAB — ANTITHROMBIN III: AntiThromb III Func: 99 % (ref 75–120)

## 2021-06-19 LAB — CMP (CANCER CENTER ONLY)
ALT: 54 U/L — ABNORMAL HIGH (ref 0–44)
AST: 35 U/L (ref 15–41)
Albumin: 4.7 g/dL (ref 3.5–5.0)
Alkaline Phosphatase: 67 U/L (ref 38–126)
Anion gap: 9 (ref 5–15)
BUN: 10 mg/dL (ref 6–20)
CO2: 24 mmol/L (ref 22–32)
Calcium: 9.8 mg/dL (ref 8.9–10.3)
Chloride: 104 mmol/L (ref 98–111)
Creatinine: 0.94 mg/dL (ref 0.61–1.24)
GFR, Estimated: >60 mL/min (ref >60)
Glucose, Bld: 97 mg/dL (ref 70–99)
Potassium: 4.1 mmol/L (ref 3.5–5.1)
Sodium: 137 mmol/L (ref 135–145)
Total Bilirubin: 0.5 mg/dL (ref 0.3–1.2)
Total Protein: 7.5 g/dL (ref 6.5–8.1)

## 2021-06-19 LAB — CBC WITH DIFFERENTIAL (CANCER CENTER ONLY)
Abs Immature Granulocytes: 0.01 10*3/uL (ref 0.00–0.07)
Basophils Absolute: 0 10*3/uL (ref 0.0–0.1)
Basophils Relative: 1 %
Eosinophils Absolute: 0 10*3/uL (ref 0.0–0.5)
Eosinophils Relative: 1 %
HCT: 44.2 % (ref 39.0–52.0)
Hemoglobin: 14.8 g/dL (ref 13.0–17.0)
Immature Granulocytes: 0 %
Lymphocytes Relative: 50 %
Lymphs Abs: 2.1 10*3/uL (ref 0.7–4.0)
MCH: 29.4 pg (ref 26.0–34.0)
MCHC: 33.5 g/dL (ref 30.0–36.0)
MCV: 87.9 fL (ref 80.0–100.0)
Monocytes Absolute: 0.5 10*3/uL (ref 0.1–1.0)
Monocytes Relative: 13 %
Neutro Abs: 1.5 10*3/uL — ABNORMAL LOW (ref 1.7–7.7)
Neutrophils Relative %: 35 %
Platelet Count: 269 10*3/uL (ref 150–400)
RBC: 5.03 MIL/uL (ref 4.22–5.81)
RDW: 14.2 % (ref 11.5–15.5)
WBC Count: 4.2 10*3/uL (ref 4.0–10.5)
nRBC: 0 % (ref 0.0–0.2)

## 2021-06-19 LAB — D-DIMER, QUANTITATIVE: D-Dimer, Quant: <0.27 ug/mL-FEU (ref 0.00–0.50)

## 2021-06-19 LAB — LACTATE DEHYDROGENASE: LDH: 159 U/L (ref 98–192)

## 2021-06-19 NOTE — Progress Notes (Signed)
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 He was treated with popliteal artery embolectomy on 02/24/2019 and the left BKA on 02/26/2019. He had no prior history of thrombotic event. He denies any complications with surgery.  He states that around this time he had been donating plasma frequently. He was told during these visits that his blood was quite thick and his IV would frequently clot off.  He is taking a baby aspirin daily.  He has had some fatigue.  Renal artery US showed "moderate renal artery stenosis about the mid left renal artery and wedge like cortical scarring about the left interpolar region compatible with history of prior left renal infarction". He is followed closely by [facility] Dr. Candiss Norse.  He notes dizziness and SOB at night when sleeping. He has not been tested for sleep apnea but states that  up often and does not rest well.  He uses an inhaler for the SOB and sleeps with a fan on him.  He notes the he sweats quite a bit.  He does smoke 2 packs per week as well as cannabis once a week. He drinks 1 beer a day.  No hormone supplements.  No known sickle cell disease or trait but does not think he has been tested.  No personal or known familial history of cancer.  No history of diabetes or thyroid disease.  No known family history of thrombotic event.  Colonoscopy was done in 2022 and he was noted to have internal hemorrhoids.  He has occasional nausea and  vomiting and feels that sometimes his food gets stuck when swallowing.  No fever, chills, cough, rash, chest pain, palpitations, abdominal pain or changes in bowel or bladder habits at this time.   No swelling, tenderness, numbness or tingling in her extremities.  No falls or syncope reported. He ambulate with a left lower extremity prosthesis.  Appetite and hydration are good. He states that he eats lots of salads. Weight is stable at 209 lbs (prosthesis weighs 6 lbs).   ROS: All other 10 point review of systems is negative.   PAST MEDICAL HISTORY:   Past Medical History:  Diagnosis Date   Asthma    COVID-19 05/2020   Hyperlipidemia    Hypertension    Kidney stone    Pre-diabetes 07/2020    ALLERGIES: No Known Allergies    MEDICATIONS:  Current Outpatient Medications on File Prior to Visit  Medication Sig Dispense Refill   aspirin 81 MG chewable tablet Chew by mouth daily.     atorvastatin (LIPITOR) 10 MG tablet take 1 tablet (10 mg) by oral route once daily for 90 days     budesonide-formoterol (SYMBICORT) 160-4.5 MCG/ACT inhaler inhale 2 puffs by inhalation route 2 times per day in the morning and evening for 30 days     cetirizine (ZYRTEC) 10 MG tablet Take 10 mg by mouth daily.     fluticasone-salmeterol (ADVAIR) 250-50 MCG/ACT AEPB Inhale into the lungs.  lisinopril (ZESTRIL) 10 MG tablet Take 10 mg by mouth daily.     No current facility-administered medications on file prior to visit.     PAST SURGICAL HISTORY Past Surgical History:  Procedure Laterality Date   BELOW KNEE LEG AMPUTATION Left     FAMILY HISTORY: Family History  Problem Relation Age of Onset   Colon cancer Neg Hx    Colon polyps Neg Hx    Esophageal cancer Neg Hx    Rectal cancer Neg Hx    Stomach cancer Neg Hx     SOCIAL HISTORY:  reports that he has been smoking cigarettes. He has been smoking an average of .5 packs per day. He has never used smokeless tobacco. He reports current alcohol  use. He reports current drug use. Drug: Marijuana.  PERFORMANCE STATUS: The patient's performance status is 1 - Symptomatic but completely ambulatory  PHYSICAL EXAM: Most Recent Vital Signs: Blood pressure 138/87, pulse 92, temperature 97.8 F (36.6 C), temperature source Oral, resp. rate 18, height 5\' 5"  (1.651 m), weight 209 lb 1.9 oz (94.9 kg), SpO2 100 %. BP 138/87 (BP Location: Left Arm, Patient Position: Sitting)   Pulse 92   Temp 97.8 F (36.6 C) (Oral)   Resp 18   Ht 5\' 5"  (1.651 m)   Wt 209 lb 1.9 oz (94.9 kg)   SpO2 100%   BMI 34.80 kg/m   General Appearance:    Alert, cooperative, no distress, appears stated age  Head:    Normocephalic, without obvious abnormality, atraumatic  Eyes:    PERRL, conjunctiva/corneas clear, EOM's intact, fundi    benign, both eyes             Throat:   Lips, mucosa, and tongue normal; teeth and gums normal  Neck:   Supple, symmetrical, trachea midline, no adenopathy;       thyroid:  No enlargement/tenderness/nodules; no carotid   bruit or JVD  Back:     Symmetric, no curvature, ROM normal, no CVA tenderness  Lungs:     Clear to auscultation bilaterally, respirations unlabored  Chest wall:    No tenderness or deformity  Heart:    Regular rate and rhythm, S1 and S2 normal, no murmur, rub   or gallop  Abdomen:     Soft, non-tender, bowel sounds active all four quadrants,    no masses, no organomegaly        Extremities:   Extremities normal, left BKA, no cyanosis or edema  Pulses:   2+ and symmetric all extremities  Skin:   Skin color, texture, turgor normal, no rashes or lesions  Lymph nodes:   Cervical, supraclavicular, and axillary nodes normal  Neurologic:   CNII-XII intact. Normal strength, sensation and reflexes      throughout    LABORATORY DATA:  Results for orders placed or performed in visit on 06/19/21 (from the past 48 hour(s))  D-dimer, quantitative     Status: None   Collection Time: 06/19/21 10:40 AM  Result Value  Ref Range   D-Dimer, Quant <0.27 0.00 - 0.50 ug/mL-FEU    Comment: (NOTE) At the manufacturer cut-off value of 0.5 g/mL FEU, this assay has a negative predictive value of 95-100%.This assay is intended for use in conjunction with a clinical pretest probability (PTP) assessment model to exclude pulmonary embolism (PE) and deep venous thrombosis (DVT) in outpatients suspected of PE or DVT. Results should be correlated with clinical presentation. Performed at [facility], [place],  [place], [place] [address]       RADIOGRAPHY: No results found.     PATHOLOGY: None  ASSESSMENT/PLAN: Mr. Villapando is a very pleasant 56 yo African American gentleman with history of left popliteal artery thrombus diagnosed in 2021 and resulted in left BKA. 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  Hyper coag panel is pending. We will determine if he need to continue aspirin or transition back onto anticoagulation long term.  We do recommend he follow-up with PCP regarding sleep study to assess for sleep apnea as this can cause blood to thicken.  We will determine when he needs to follow-up once his lab results are available.   All questions were answered. The patient knows to call the clinic with any problems, questions or concerns. We can certainly see the patient much sooner if necessary.  The patient was discussed with Dr. Marin Olp and he is in agreement with the aforementioned.   Lottie Dawson, NP

## 2021-06-20 LAB — PROTEIN S, TOTAL: Protein S Ag, Total: 92 % (ref 60–150)

## 2021-06-20 LAB — LUPUS ANTICOAGULANT PANEL
DRVVT: 35.8 s (ref 0.0–47.0)
PTT Lupus Anticoagulant: 29.6 s (ref 0.0–43.5)

## 2021-06-20 LAB — CARDIOLIPIN ANTIBODIES, IGG, IGM, IGA
Anticardiolipin IgA: <9 APL U/mL (ref 0–11)
Anticardiolipin IgG: 14 GPL U/mL (ref 0–14)
Anticardiolipin IgM: <9 MPL U/mL (ref 0–12)

## 2021-06-20 LAB — PROTEIN S ACTIVITY: Protein S Activity: 116 % (ref 63–140)

## 2021-06-20 LAB — PROTEIN C, TOTAL: Protein C, Total: 105 % (ref 60–150)

## 2021-06-20 LAB — PROTEIN C ACTIVITY: Protein C Activity: 139 % (ref 73–180)

## 2021-06-21 LAB — BETA-2-GLYCOPROTEIN I ABS, IGG/M/A
Beta-2 Glyco I IgG: <9 GPI IgG units (ref 0–20)
Beta-2-Glycoprotein I IgA: <9 GPI IgA units (ref 0–25)
Beta-2-Glycoprotein I IgM: <9 GPI IgM units (ref 0–32)

## 2021-06-22 LAB — HOMOCYSTEINE: Homocysteine: 13.7 umol/L (ref 0.0–14.5)

## 2021-06-23 ENCOUNTER — Telehealth: Payer: Self-pay | Admitting: *Deleted

## 2021-06-23 LAB — HGB FRACTIONATION CASCADE
Hgb A2: 2.6 % (ref 1.8–3.2)
Hgb A: 97.4 % (ref 96.4–98.8)
Hgb F: 0 % (ref 0.0–2.0)
Hgb S: 0 %

## 2021-06-23 NOTE — Telephone Encounter (Signed)
Per 06/19/21 los - to be determined

## 2021-06-26 LAB — FACTOR 5 LEIDEN

## 2021-06-29 LAB — PROTHROMBIN GENE MUTATION

## 2021-07-06 ENCOUNTER — Telehealth: Payer: Self-pay | Admitting: Family

## 2021-07-06 NOTE — Telephone Encounter (Signed)
I was able to speak with Mr. Maahs and go over recent hyper coag panel results which were negative. He will continue taking his baby aspirin daily with food. No follow-up needed. We are certainly here for any future heme onc needs that may occur. Patient verbalized understanding and was appreciative of call.

## 2022-09-02 IMAGING — CT CT ANGIO CHEST
2 of 8 series · 19 of 36 positions shown · IV contrast (Omnipaque)
Comparison: CT February 25, 2019 and February 23, 2019.

CLINICAL DATA: Intermittent chest pain

EXAM:
CT ANGIOGRAPHY CHEST WITH CONTRAST
TECHNIQUE: Multidetector CT imaging of the chest was performed using the
standard protocol during bolus administration of intravenous
contrast. Multiplanar CT image reconstructions and MIPs were
obtained to evaluate the vascular anatomy.
CONTRAST:  100 cc of Omnipaque 350

[Series 6: pe coronal mpr · coronal · 0.61mm/px · 1 of 179 slices shown]
[im 90/179  mediastinal]
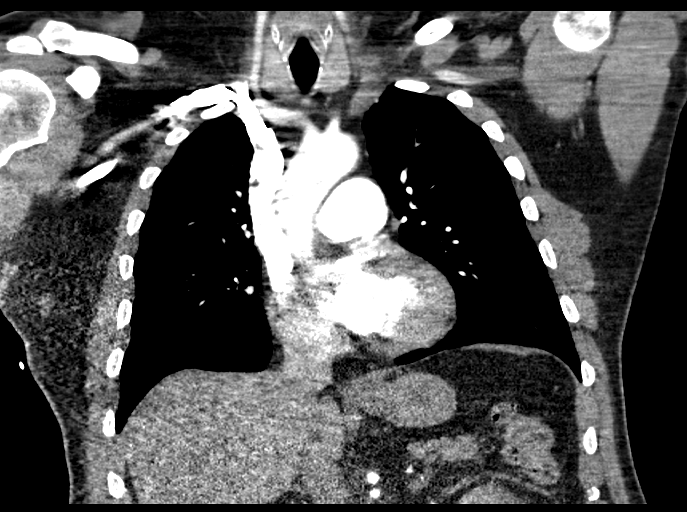

[Series 10: pe thins · axial · 0.83mm/px · z∈[+74,+343]mm · 18 of 301 slices shown]
[im 16/301  lung]
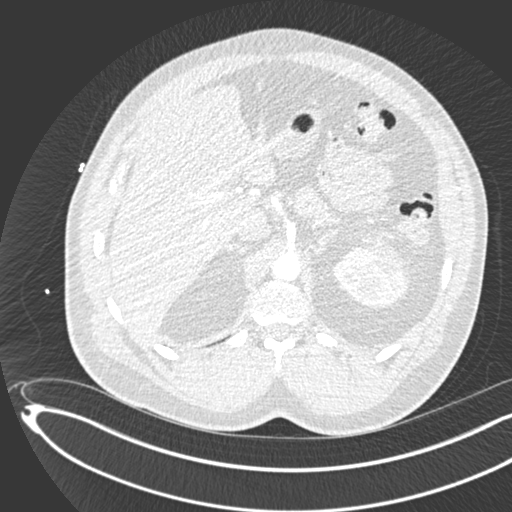
[im 32/301  mediastinal]
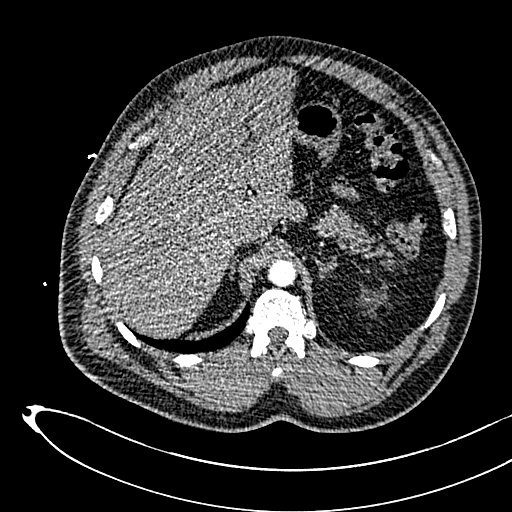
[im 48/301  lung]
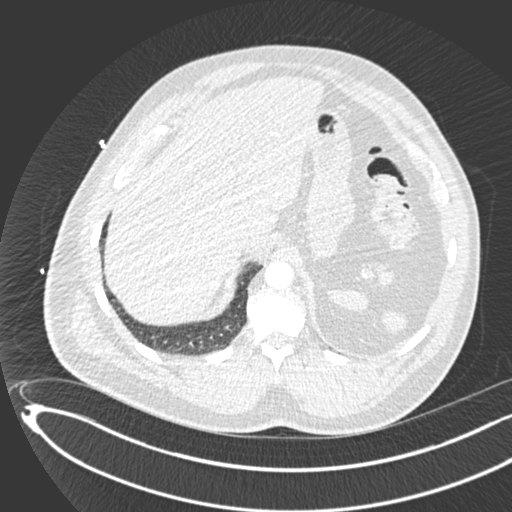
[im 64/301  mediastinal]
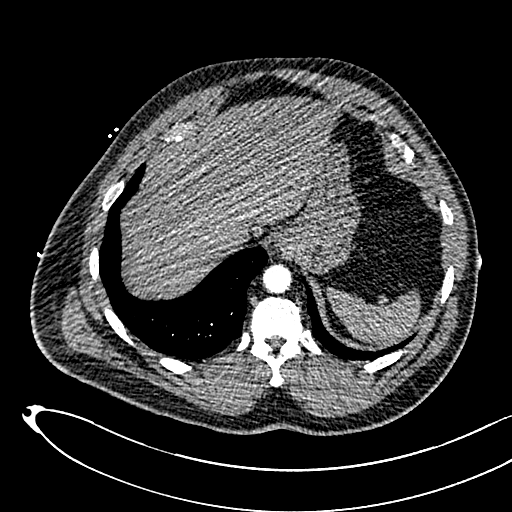
[im 79/301  lung]
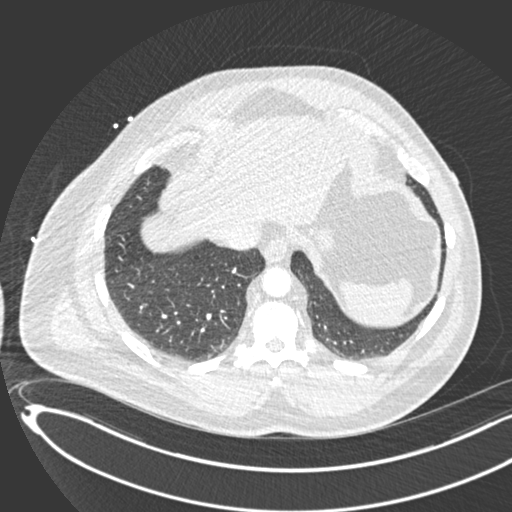
[im 95/301  mediastinal]
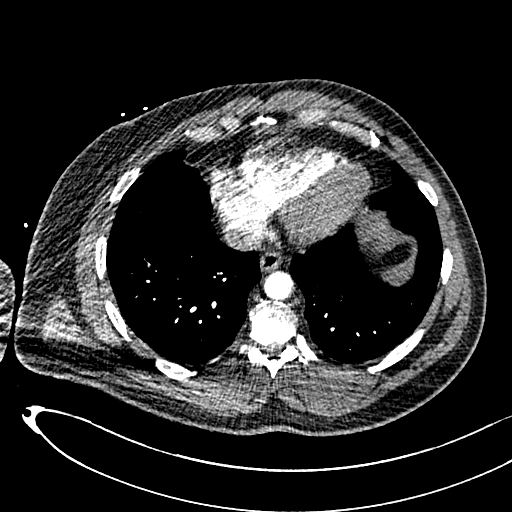
[im 111/301  lung]
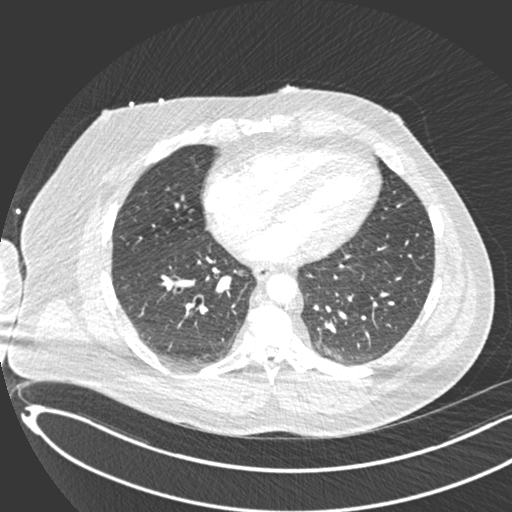
[im 127/301  mediastinal]
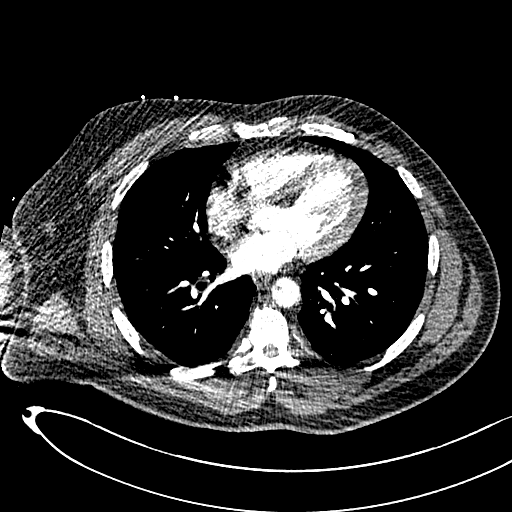
[im 143/301  lung]
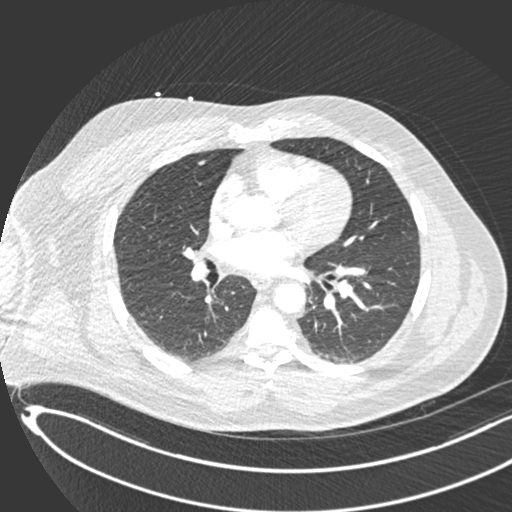
[im 158/301  mediastinal]
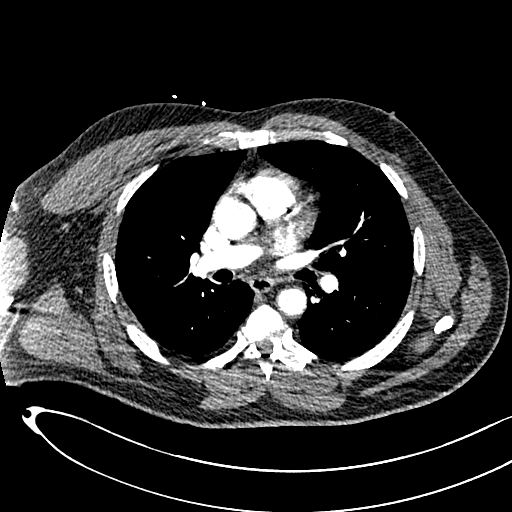
[im 174/301  lung]
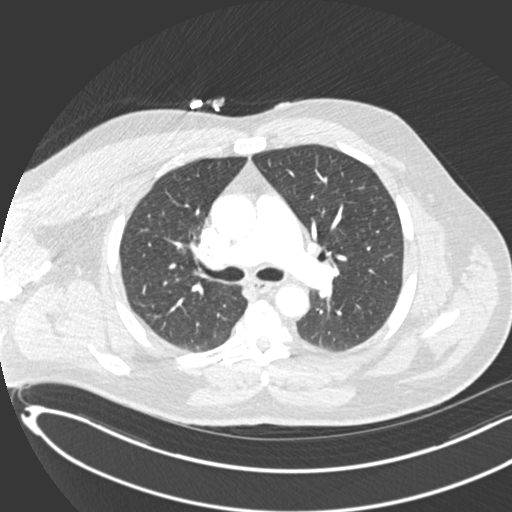
[im 190/301  mediastinal]
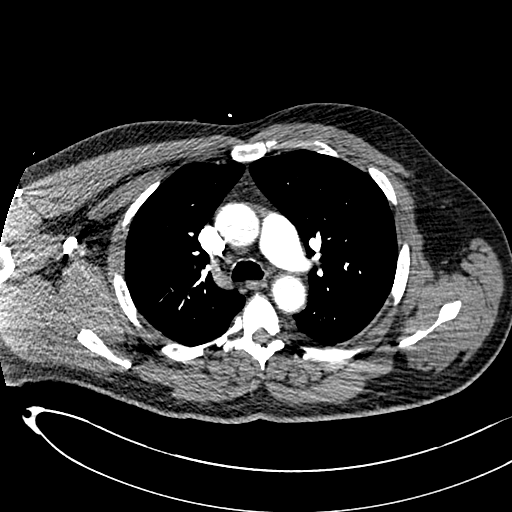
[im 206/301  lung]
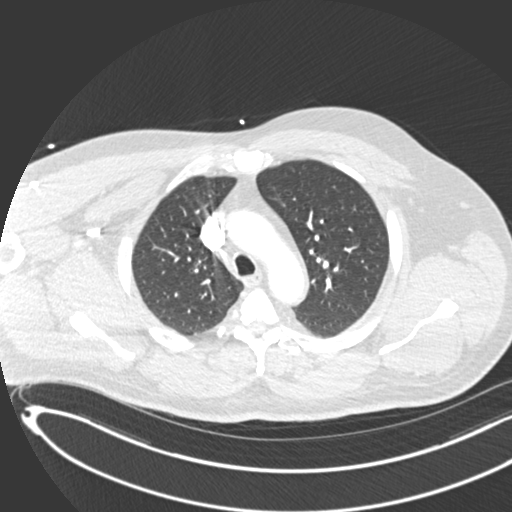
[im 222/301  mediastinal]
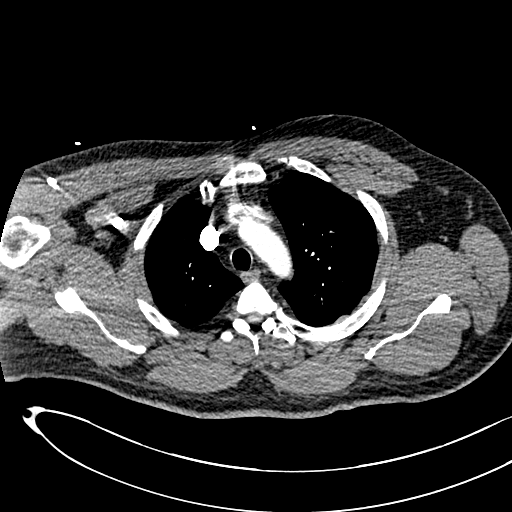
[im 237/301  lung]
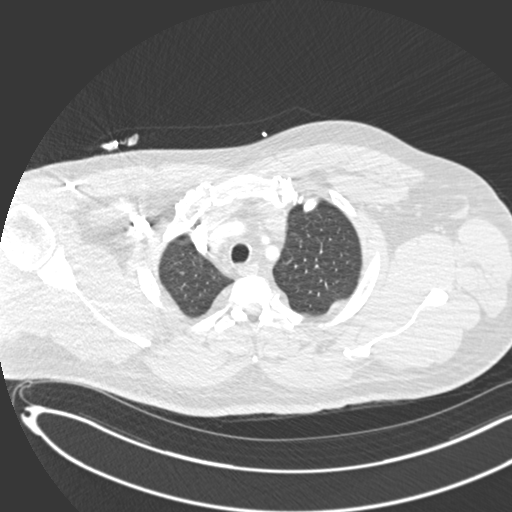
[im 253/301  mediastinal]
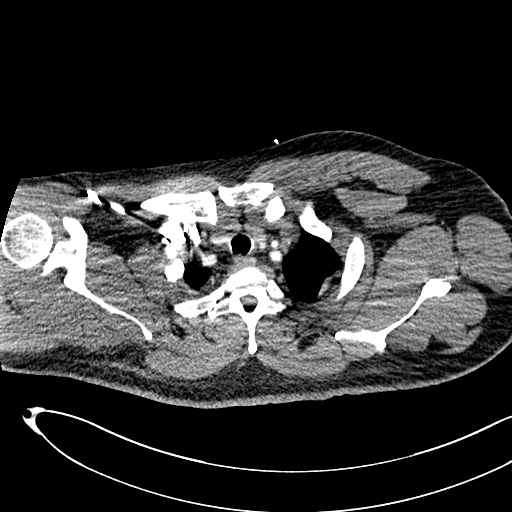
[im 269/301  lung]
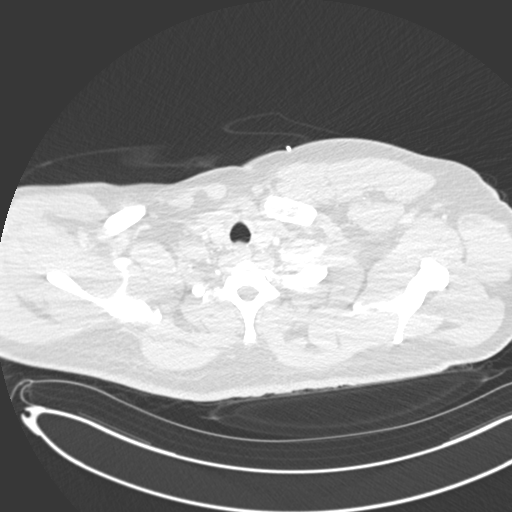
[im 285/301  mediastinal]
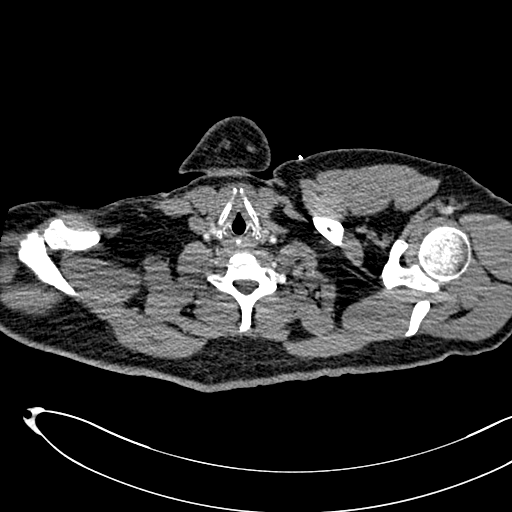

[19 of 36 positions shown; findings below may reference images not displayed]

FINDINGS: Cardiovascular: Satisfactory opacification of the pulmonary arteries
to the segmental level. No evidence of pulmonary embolism. Normal
heart size. No pericardial effusion.

Mediastinum/Nodes: No enlarged mediastinal, hilar, or axillary lymph
nodes. Thyroid gland, trachea, and esophagus demonstrate no
significant findings.

Lungs/Pleura: Lungs are clear. No pleural effusion or pneumothorax.

Upper Abdomen: Heterogeneous area of enhancement in the upper pole
the left kidney, coronal image 74/8 with adjacent perinephric
stranding.

Musculoskeletal: Mild thoracic spondylosis. No acute osseous
abnormality.

Review of the MIP images confirms the above findings.
IMPRESSION: 1. No evidence of pulmonary embolism or other acute intrathoracic
process.
2. Heterogeneous area of enhancement in the upper pole of the left
kidney with adjacent inflammatory stranding, which is suspicious for
focal pyelonephritis. Recommend follow-up CT abdomen and pelvis with
and without contrast following completion of therapy to ensure
resolution.

## 2022-09-29 IMAGING — CT CT ABD-PEL WO/W CM
2 of 11 series · 11 of 46 positions shown, 17 images · IV contrast (omnipaque)
Comparison: Multiple exams, including CT chest 12/19/2020 and CT
angiogram of 02/23/2019

CLINICAL DATA: Left renal lesion on prior CT chest of 12/19/2020,
for further characterization and reassessment.

EXAM:
CT ABDOMEN AND PELVIS WITHOUT AND WITH CONTRAST
TECHNIQUE: Multidetector CT imaging of the abdomen and pelvis was performed
following the standard protocol before and following the bolus
administration of intravenous contrast.
CONTRAST:  100mL OMNIPAQUE IOHEXOL 300 MG/ML  SOLN

[Series 4: coronal pre · coronal · non-contrast · 0.78mm/px · 2 of 112 slices shown]
[im 38/112  soft-tissue]
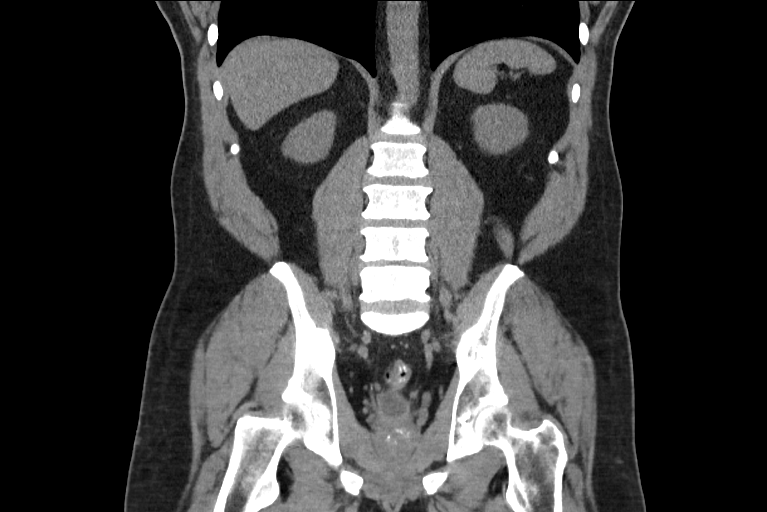
[im 75/112  soft-tissue]
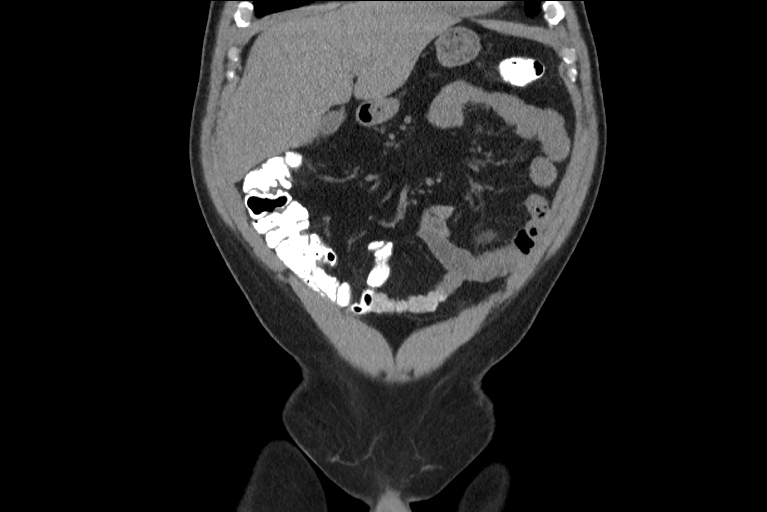

[Series 10: axial nephro · axial · 0.89mm/px · z∈[-386,-47]mm · 9 of 139 slices shown, 15 images]
[im 13/139  soft-tissue]
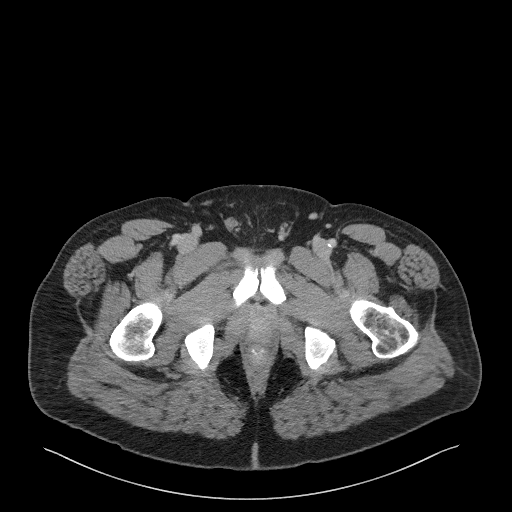
[im 13/139  bone]
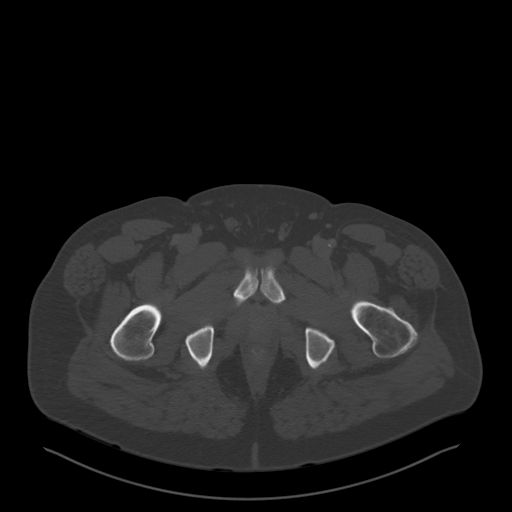
[im 26/139  soft-tissue]
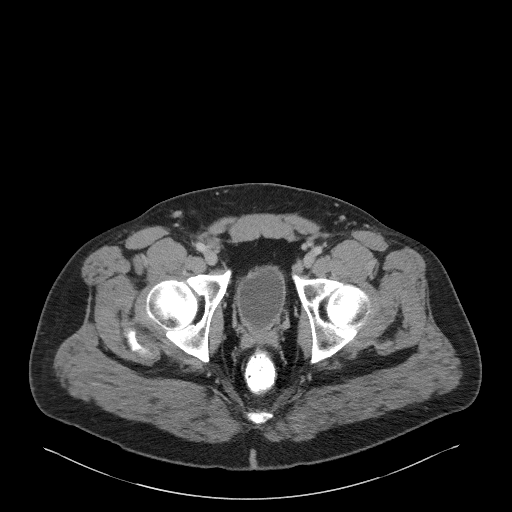
[im 38/139  soft-tissue]
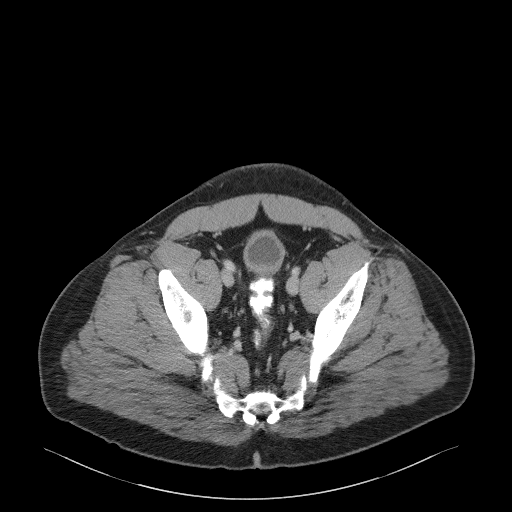
[im 51/139  soft-tissue]
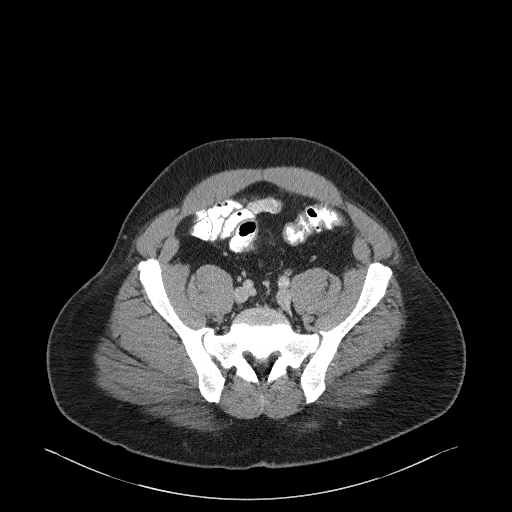
[im 76/139  soft-tissue]
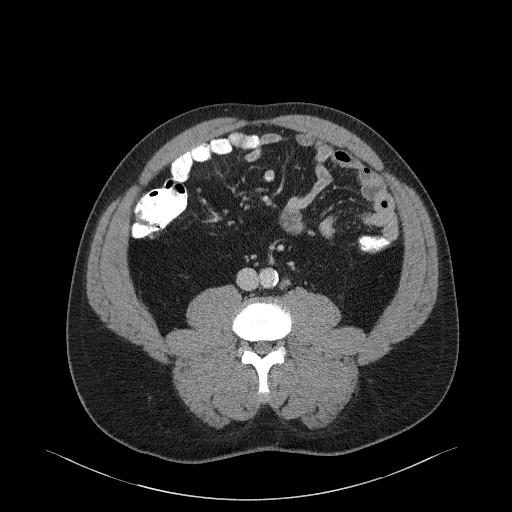
[im 88/139  soft-tissue]
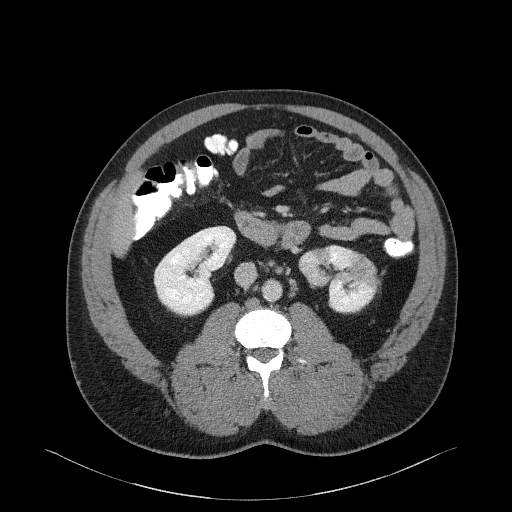
[im 88/139  lung]
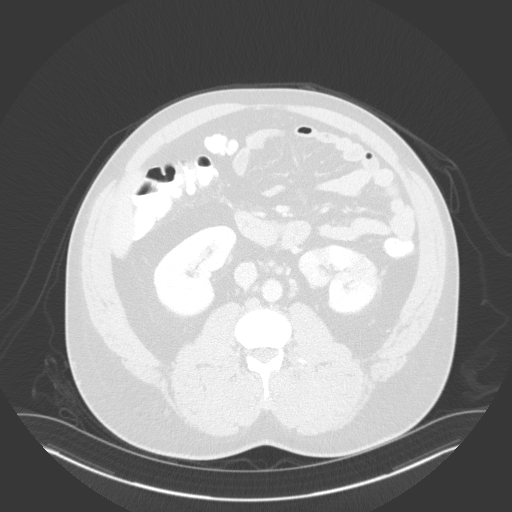
[im 101/139  soft-tissue]
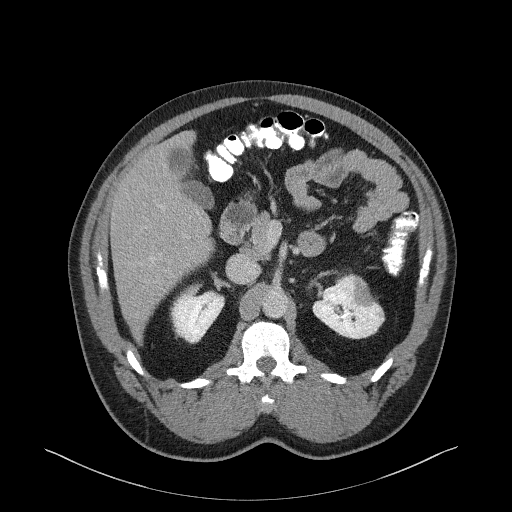
[im 101/139  lung]
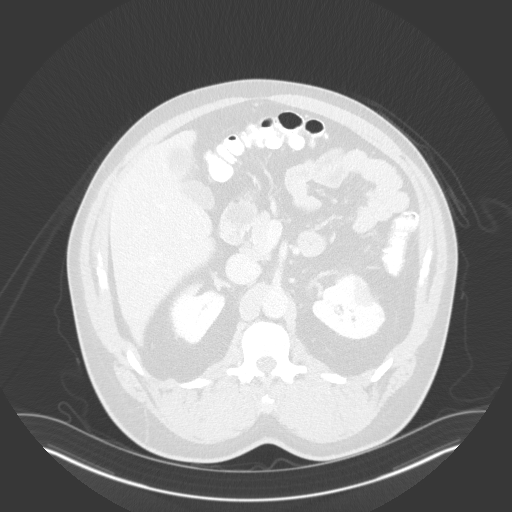
[im 113/139  soft-tissue]
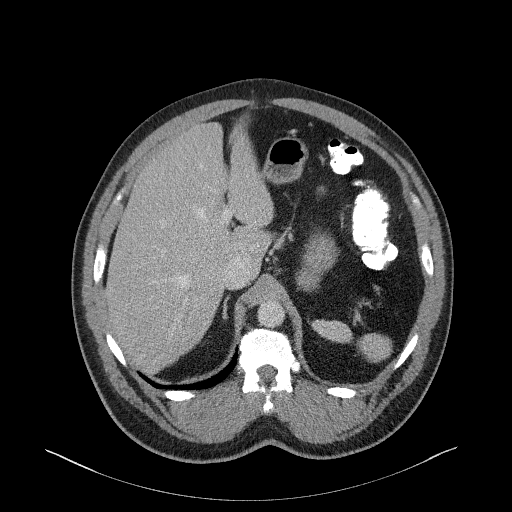
[im 113/139  lung]
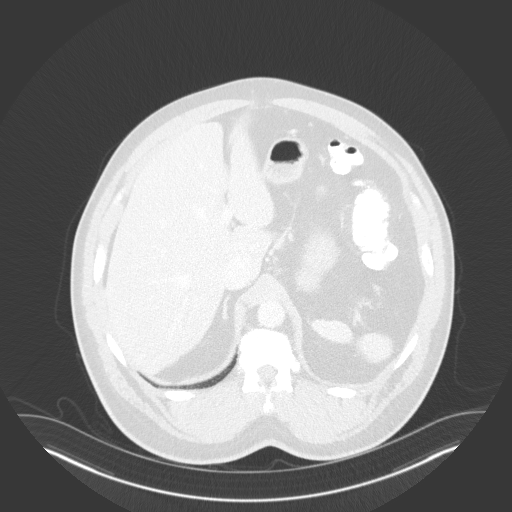
[im 126/139  soft-tissue]
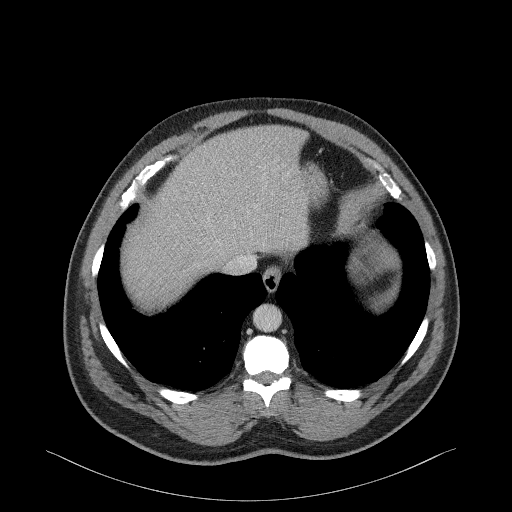
[im 126/139  lung]
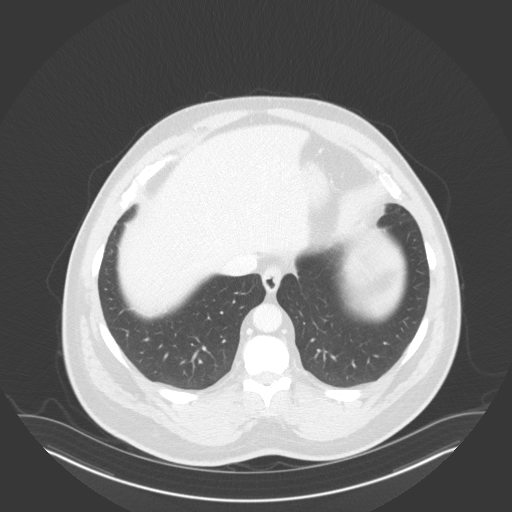
[im 126/139  bone]
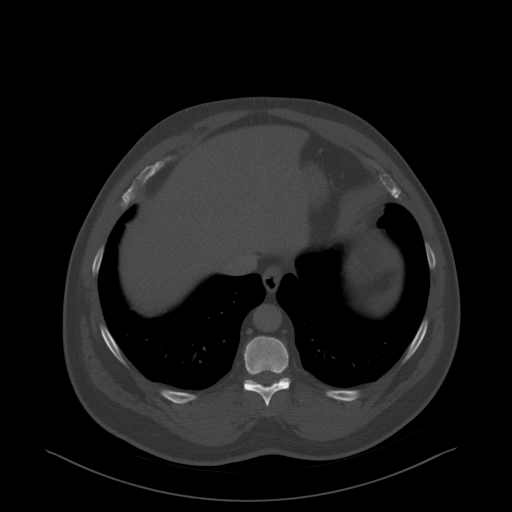

[11 of 46 positions shown; findings below may reference images not displayed]

FINDINGS: Lower chest: Unremarkable

Hepatobiliary: Unremarkable

Pancreas: Unremarkable

Spleen: Unremarkable

Adrenals/Urinary Tract: Both adrenal glands appear normal. The right
kidney appears unremarkable.

Bandlike and triangular regions of hypodensity are scattered in the
left kidney. In the upper pole, 1 of these triangular regions
corresponds to the previous abnormality although there is been some
interval parenchymal loss, and there is also parenchymal thinning in
the other triangular regions of abnormal hypodensity in the left
kidney lower pole. I do not see definite capsule rim sign but given
the parenchymal thinning, the appearance is most compatible with
subacute and chronic renal infarcts which are favored over
pyelonephritis. Substantial multifocal involvement of the left
kidney without involvement of the right kidney. No urinary tract
calculi, hydronephrosis, or hydroureter. No current substantial
degree of perinephric stranding. Urinary bladder unremarkable

Stomach/Bowel: Unremarkable

Vascular/Lymphatic: Mild to moderate aortoiliac atherosclerosis
noted. Today's exam was not a dedicated angiogram, but did include
arterial phase images which demonstrate no prominent proximal renal
artery stenosis or specific indicator is to a cause for the
suspected left renal infarcts. No renal vein thrombosis is
appreciated.

No pathologic adenopathy.

Reproductive: Dystrophic calcifications centrally in the prostate
gland.

Other: No supplemental non-categorized findings.

Musculoskeletal: Lower lumbar spondylosis and degenerative disc
disease. Small ventral hernia in upper abdomen on image 67 series 13
contains adipose tissue.
IMPRESSION: 1. Multiple hypodense regions of the left kidney, some of which
including the originally seen lesion from earlier this month
demonstrate new cortical thinning suggesting renal infarcts. Less
cortical thinning in hypodensity in the left kidney lower pole
anteriorly. No definite cortical rim sign. I do not see compelling
findings of FMD or vasculitis along the left renal artery, and a
specific cause for these suspected infarcts is not identified.
Correlate with any known source for emboli. Pyelonephritis is not
totally excluded but seems less likely given the cortical thinning,
correlate with urine analysis. The right kidney appears
unremarkable.
2. Mild to moderate aorto aortoiliac atherosclerosis.
3. Small ventral hernia in the upper abdomen containing adipose
tissue.
4. Lower lumbar spondylosis and degenerative disc disease.

## 2023-06-16 ENCOUNTER — Encounter: Payer: Self-pay | Admitting: Internal Medicine

## 2023-08-03 ENCOUNTER — Ambulatory Visit: Admitting: Physician Assistant

## 2023-09-15 ENCOUNTER — Ambulatory Visit: Admitting: Physician Assistant
